# Patient Record
Sex: Male | Born: 1940 | Race: White | Hispanic: No | Smoking: Former smoker
Health system: Southern US, Community
[De-identification: ages and names within clinical notes are randomized; demographics above are authoritative.]

## PROBLEM LIST (undated history)

## (undated) DIAGNOSIS — I10 Essential (primary) hypertension: Secondary | ICD-10-CM

## (undated) DIAGNOSIS — Z974 Presence of external hearing-aid: Secondary | ICD-10-CM

## (undated) DIAGNOSIS — Z9109 Other allergy status, other than to drugs and biological substances: Secondary | ICD-10-CM

## (undated) DIAGNOSIS — E039 Hypothyroidism, unspecified: Secondary | ICD-10-CM

## (undated) DIAGNOSIS — M1990 Unspecified osteoarthritis, unspecified site: Secondary | ICD-10-CM

## (undated) HISTORY — PX: ROTATOR CUFF REPAIR: SHX139

## (undated) HISTORY — PX: CATARACT EXTRACTION W/ INTRAOCULAR LENS  IMPLANT, BILATERAL: SHX1307

---

## 2007-04-29 ENCOUNTER — Other Ambulatory Visit: Payer: Self-pay

## 2007-04-29 ENCOUNTER — Ambulatory Visit: Payer: Self-pay | Admitting: Ophthalmology

## 2007-05-06 ENCOUNTER — Ambulatory Visit: Payer: Self-pay | Admitting: Ophthalmology

## 2011-06-19 ENCOUNTER — Ambulatory Visit: Payer: Self-pay | Admitting: Ophthalmology

## 2011-12-18 ENCOUNTER — Ambulatory Visit: Payer: Self-pay | Admitting: Ophthalmology

## 2015-07-19 ENCOUNTER — Encounter: Payer: Self-pay | Admitting: *Deleted

## 2015-07-20 NOTE — Discharge Instructions (Signed)

## 2015-07-24 ENCOUNTER — Ambulatory Visit: Payer: Medicare PPO | Admitting: Anesthesiology

## 2015-07-24 ENCOUNTER — Ambulatory Visit
Admission: RE | Admit: 2015-07-24 | Discharge: 2015-07-24 | Disposition: A | Discharge status: Home / Self Care | Payer: Medicare PPO | Source: Ambulatory Visit | Attending: Ophthalmology | Admitting: Ophthalmology

## 2015-07-24 ENCOUNTER — Encounter
Admission: RE | Disposition: A | Discharge status: Home / Self Care | Payer: Self-pay | Source: Ambulatory Visit | Attending: Ophthalmology

## 2015-07-24 DIAGNOSIS — Z87891 Personal history of nicotine dependence: Secondary | ICD-10-CM | POA: Diagnosis not present

## 2015-07-24 DIAGNOSIS — Z9842 Cataract extraction status, left eye: Secondary | ICD-10-CM | POA: Insufficient documentation

## 2015-07-24 DIAGNOSIS — H401110 Primary open-angle glaucoma, right eye, stage unspecified: Secondary | ICD-10-CM | POA: Diagnosis not present

## 2015-07-24 DIAGNOSIS — H4440 Unspecified hypotony of eye: Secondary | ICD-10-CM | POA: Diagnosis not present

## 2015-07-24 DIAGNOSIS — H9193 Unspecified hearing loss, bilateral: Secondary | ICD-10-CM | POA: Diagnosis not present

## 2015-07-24 DIAGNOSIS — E079 Disorder of thyroid, unspecified: Secondary | ICD-10-CM | POA: Insufficient documentation

## 2015-07-24 DIAGNOSIS — Z9841 Cataract extraction status, right eye: Secondary | ICD-10-CM | POA: Diagnosis not present

## 2015-07-24 DIAGNOSIS — Z880 Allergy status to penicillin: Secondary | ICD-10-CM | POA: Insufficient documentation

## 2015-07-24 DIAGNOSIS — Z885 Allergy status to narcotic agent status: Secondary | ICD-10-CM | POA: Insufficient documentation

## 2015-07-24 DIAGNOSIS — Z888 Allergy status to other drugs, medicaments and biological substances status: Secondary | ICD-10-CM | POA: Insufficient documentation

## 2015-07-24 DIAGNOSIS — H401190 Primary open-angle glaucoma, unspecified eye, stage unspecified: Secondary | ICD-10-CM | POA: Diagnosis present

## 2015-07-24 HISTORY — DX: Presence of external hearing-aid: Z97.4

## 2015-07-24 HISTORY — DX: Essential (primary) hypertension: I10

## 2015-07-24 HISTORY — DX: Hypothyroidism, unspecified: E03.9

## 2015-07-24 HISTORY — DX: Unspecified osteoarthritis, unspecified site: M19.90

## 2015-07-24 HISTORY — PX: SCAR REVISION: SHX5285

## 2015-07-24 HISTORY — DX: Other allergy status, other than to drugs and biological substances: Z91.09

## 2015-07-24 SURGERY — REVISION, SCAR
Anesthesia: Monitor Anesthesia Care | Laterality: Right | Wound class: Clean

## 2015-07-24 MED ORDER — TETRACAINE HCL 0.5 % OP SOLN
OPHTHALMIC | Status: DC | PRN
  Administered 2015-07-24: 3 [drp]

## 2015-07-24 MED ORDER — NEOMYCIN-POLYMYXIN-DEXAMETH 3.5-10000-0.1 OP OINT
TOPICAL_OINTMENT | OPHTHALMIC | Status: DC | PRN
  Administered 2015-07-24: 1

## 2015-07-24 MED ORDER — LACTATED RINGERS IV SOLN: INTRAVENOUS | Status: DC

## 2015-07-24 MED ORDER — MOXIFLOXACIN HCL 0.5 % OP SOLN
1.0000 [drp] | OPHTHALMIC | Status: DC | PRN
  Administered 2015-07-24 (×3): 1 [drp] via OPHTHALMIC

## 2015-07-24 MED ORDER — MIDAZOLAM HCL 2 MG/2ML IJ SOLN
INTRAMUSCULAR | Status: DC | PRN
  Administered 2015-07-24: 2 mg via INTRAVENOUS

## 2015-07-24 MED ORDER — FENTANYL CITRATE (PF) 100 MCG/2ML IJ SOLN
INTRAMUSCULAR | Status: DC | PRN
  Administered 2015-07-24: 50 ug via INTRAVENOUS

## 2015-07-24 MED ORDER — TETRACAINE HCL 0.5 % OP SOLN
1.0000 [drp] | OPHTHALMIC | Status: DC | PRN
  Administered 2015-07-24: 1 [drp] via OPHTHALMIC

## 2015-07-24 MED ORDER — LIDOCAINE HCL (PF) 4 % IJ SOLN
INTRAMUSCULAR | Status: DC | PRN
  Administered 2015-07-24: .1 mL via OPHTHALMIC

## 2015-07-24 SURGICAL SUPPLY — 32 items
APPLICATOR COTTON TIP 3IN (MISCELLANEOUS) ×4 IMPLANT
BANDAGE EYE OVAL (MISCELLANEOUS) ×4 IMPLANT
BLADE SCLEROTME MULTI-SIDE (MISCELLANEOUS) IMPLANT
CANNULA ANT/CHMB 27GA (MISCELLANEOUS) ×4 IMPLANT
CORD BIP STRL DISP 12FT (MISCELLANEOUS) IMPLANT
CUP MEDICINE 2OZ PLAST GRAD ST (MISCELLANEOUS) IMPLANT
ERASER TAPRD BLUNT STR 20-23GA (MISCELLANEOUS) ×1 IMPLANT
GLOVE BIO SURGEON STRL SZ7 (GLOVE) ×2 IMPLANT
GLOVE SURG LX 6.5 MICRO (GLOVE) ×1
GLOVE SURG LX STRL 6.5 MICRO (GLOVE) ×1 IMPLANT
GOWN STRL REUS W/ TWL LRG LVL3 (GOWN DISPOSABLE) ×2 IMPLANT
GOWN STRL REUS W/TWL LRG LVL3 (GOWN DISPOSABLE) ×2
KIT ROOM TURNOVER OR (KITS) IMPLANT
KNIFE OPTIMUM SIDEPORT 15DEG (MISCELLANEOUS) IMPLANT
MARKER SKIN SURG W/RULER VIO (MISCELLANEOUS) ×2 IMPLANT
NEEDLE FILTER BLUNT 18X 1/2SAF (NEEDLE) ×1
NEEDLE FILTER BLUNT 18X1 1/2 (NEEDLE) ×1 IMPLANT
NEEDLE HYPO 26X3/8 (NEEDLE) IMPLANT
NEEDLE HYPO 30GX1 BEV (NEEDLE) ×2 IMPLANT
PACK EYE AFTER SURG (MISCELLANEOUS) ×2 IMPLANT
SPONGE SURG I SPEAR (MISCELLANEOUS) IMPLANT
SUT ETHILON 10-0 CS-B-6CS-B-6 (SUTURE) ×2
SUT VICRYL 7 0 TG140 8 (SUTURE) ×2 IMPLANT
SUT VICRYL 8 0 BV 130 5 (SUTURE) IMPLANT
SUTURE EHLN 10-0 CS-B-6CS-B-6 (SUTURE) ×1 IMPLANT
SYR 3ML LL SCALE MARK (SYRINGE) ×6 IMPLANT
SYR TB 1ML LUER SLIP (SYRINGE) IMPLANT
SYRINGE 10CC LL (SYRINGE) IMPLANT
TAPERED BLUNT TIP STR 20-23GA (MISCELLANEOUS) ×2
WATER STERILE IRR 250ML POUR (IV SOLUTION) ×2 IMPLANT
WATER STERILE IRR 500ML POUR (IV SOLUTION) IMPLANT
WIPE NON LINTING 3.25X3.25 (MISCELLANEOUS) ×2 IMPLANT

## 2015-07-24 NOTE — Anesthesia Procedure Notes (Signed)
Procedure Name: MAC Performed by: Detra Bores Pre-anesthesia Checklist: Patient identified, Emergency Drugs available, Suction available, Timeout performed and Patient being monitored Patient Re-evaluated:Patient Re-evaluated prior to inductionOxygen Delivery Method: Nasal cannula Placement Confirmation: positive ETCO2       

## 2015-07-24 NOTE — Anesthesia Postprocedure Evaluation (Signed)
  Anesthesia Post-op Note  Patient: Anthony Moreno  Procedure(s) Performed: Procedure(s): REVISION REPAIR WOUND TRABECTULECTOMY SITE - DO NOT PULL SUPPLIES ON CARD (Right)  Anesthesia type:MAC  Patient location: PACU  Post pain: Pain level controlled  Post assessment: Post-op Vital signs reviewed, Patient's Cardiovascular Status Stable, Respiratory Function Stable, Patent Airway and No signs of Nausea or vomiting  Post vital signs: Reviewed and stable  Last Vitals:  Filed Vitals:   07/24/15 1227  BP: 134/74  Pulse: 62  Temp:   Resp: 17    Level of consciousness: awake, alert  and patient cooperative  Complications: No apparent anesthesia complications

## 2015-07-24 NOTE — Anesthesia Preprocedure Evaluation (Signed)
Anesthesia Evaluation  Patient identified by MRN, date of birth, ID band Patient awake    Airway Mallampati: II  TM Distance: >3 FB Neck ROM: Full    Dental   Pulmonary former smoker,           Cardiovascular hypertension, Pt. on medications      Neuro/Psych    GI/Hepatic   Endo/Other  Hypothyroidism   Renal/GU      Musculoskeletal   Abdominal   Peds  Hematology   Anesthesia Other Findings   Reproductive/Obstetrics                             Anesthesia Physical Anesthesia Plan  ASA: II  Anesthesia Plan: MAC   Post-op Pain Management:    Induction: Intravenous  Airway Management Planned:   Additional Equipment:   Intra-op Plan:   Post-operative Plan:   Informed Consent: I have reviewed the patients History and Physical, chart, labs and discussed the procedure including the risks, benefits and alternatives for the proposed anesthesia with the patient or authorized representative who has indicated his/her understanding and acceptance.     Plan Discussed with: CRNA  Anesthesia Plan Comments:         Anesthesia Quick Evaluation

## 2015-07-24 NOTE — Op Note (Signed)
Date of Surgery: 07/24/15  PREOPERATIVE DIAGNOSES: 1. Primary open angle glaucoma, right eye. 2. Hypotony right eye  POSTOPERATIVE DIAGNOSES: 1. Same  PROCEDURE PERFORMED: 1. Transconjunctival sutures to limit flow via trabeculectomy site, right eye.  SURGEON: Almon Hercules, MD.    ANESTHESIA: Monitored anesthesia care.  IMPLANTS: none  COMPLICATIONS: None.  DESCRIPTION OF PROCEDURE: After informed consent was obtained, the patient was brought to the operating room and placed in the supine position.  The patient was then prepped and draped in the usual sterile fashion for intraocular surgery on the right eye.  A wire lid speculum was placed.  A 7-0 vicryl suture was placed superiorly to expose the superior conjunctiva.  Three individual 10-0 nylon sutures were placed through the conjunctiva and sclera on the two sides and apex of the previous triangular trabeculectomy flap.  They were sutured tightly and knots buried beneath the conjunctiva.  The conjunctiva remained Seidel negative at the end of this process.  At the end of the case, the corneal limbal traction suture was removed as was the wire lid speculum.  The eye was dressed with an application of Maxitrol ointment, and the eye was pressure patched closed.  The patient was brought to the recovery area having tolerated the procedure with no complications.

## 2015-07-24 NOTE — H&P (Signed)
H+P reviewed and is up to date, please see paper chart.  

## 2015-07-24 NOTE — Transfer of Care (Signed)
Immediate Anesthesia Transfer of Care Note  Patient: Anthony Moreno  Procedure(s) Performed: Procedure(s): [place] - DO NOT PULL SUPPLIES ON CARD (Right)  Patient Location: PACU  Anesthesia Type: MAC  Level of Consciousness: awake, alert  and patient cooperative  Airway and Oxygen Therapy: Patient Spontanous Breathing and Patient connected to supplemental oxygen  Post-op Assessment: Post-op Vital signs reviewed, Patient's Cardiovascular Status Stable, Respiratory Function Stable, Patent Airway and No signs of Nausea or vomiting  Post-op Vital Signs: Reviewed and stable  Complications: No apparent anesthesia complications

## 2015-07-25 ENCOUNTER — Encounter: Payer: Self-pay | Admitting: Ophthalmology

## 2015-11-09 DIAGNOSIS — E785 Hyperlipidemia, unspecified: Secondary | ICD-10-CM | POA: Diagnosis not present

## 2015-11-09 DIAGNOSIS — R739 Hyperglycemia, unspecified: Secondary | ICD-10-CM | POA: Diagnosis not present

## 2015-11-09 DIAGNOSIS — N183 Chronic kidney disease, stage 3 (moderate): Secondary | ICD-10-CM | POA: Diagnosis not present

## 2015-11-09 DIAGNOSIS — Z1389 Encounter for screening for other disorder: Secondary | ICD-10-CM | POA: Diagnosis not present

## 2015-11-09 DIAGNOSIS — Z6830 Body mass index (BMI) 30.0-30.9, adult: Secondary | ICD-10-CM | POA: Diagnosis not present

## 2015-11-09 DIAGNOSIS — I10 Essential (primary) hypertension: Secondary | ICD-10-CM | POA: Diagnosis not present

## 2015-11-09 DIAGNOSIS — Z79899 Other long term (current) drug therapy: Secondary | ICD-10-CM | POA: Diagnosis not present

## 2015-11-09 DIAGNOSIS — E039 Hypothyroidism, unspecified: Secondary | ICD-10-CM | POA: Diagnosis not present

## 2015-11-24 DIAGNOSIS — E875 Hyperkalemia: Secondary | ICD-10-CM | POA: Diagnosis not present

## 2015-12-14 DIAGNOSIS — H401132 Primary open-angle glaucoma, bilateral, moderate stage: Secondary | ICD-10-CM | POA: Diagnosis not present

## 2015-12-20 DIAGNOSIS — H401133 Primary open-angle glaucoma, bilateral, severe stage: Secondary | ICD-10-CM | POA: Diagnosis not present

## 2016-01-15 DIAGNOSIS — L309 Dermatitis, unspecified: Secondary | ICD-10-CM | POA: Diagnosis not present

## 2016-01-15 DIAGNOSIS — D239 Other benign neoplasm of skin, unspecified: Secondary | ICD-10-CM | POA: Diagnosis not present

## 2016-01-15 DIAGNOSIS — L570 Actinic keratosis: Secondary | ICD-10-CM | POA: Diagnosis not present

## 2016-03-20 DIAGNOSIS — E039 Hypothyroidism, unspecified: Secondary | ICD-10-CM | POA: Diagnosis not present

## 2016-03-20 DIAGNOSIS — E785 Hyperlipidemia, unspecified: Secondary | ICD-10-CM | POA: Diagnosis not present

## 2016-03-20 DIAGNOSIS — N183 Chronic kidney disease, stage 3 (moderate): Secondary | ICD-10-CM | POA: Diagnosis not present

## 2016-03-20 DIAGNOSIS — E1121 Type 2 diabetes mellitus with diabetic nephropathy: Secondary | ICD-10-CM | POA: Diagnosis not present

## 2016-03-20 DIAGNOSIS — I10 Essential (primary) hypertension: Secondary | ICD-10-CM | POA: Diagnosis not present

## 2016-03-20 DIAGNOSIS — Z79899 Other long term (current) drug therapy: Secondary | ICD-10-CM | POA: Diagnosis not present

## 2016-03-20 DIAGNOSIS — Z6830 Body mass index (BMI) 30.0-30.9, adult: Secondary | ICD-10-CM | POA: Diagnosis not present

## 2016-03-20 DIAGNOSIS — Z9181 History of falling: Secondary | ICD-10-CM | POA: Diagnosis not present

## 2016-04-02 DIAGNOSIS — L570 Actinic keratosis: Secondary | ICD-10-CM | POA: Diagnosis not present

## 2016-04-02 DIAGNOSIS — C44319 Basal cell carcinoma of skin of other parts of face: Secondary | ICD-10-CM | POA: Diagnosis not present

## 2016-04-02 DIAGNOSIS — C44311 Basal cell carcinoma of skin of nose: Secondary | ICD-10-CM | POA: Diagnosis not present

## 2016-04-24 DIAGNOSIS — H401133 Primary open-angle glaucoma, bilateral, severe stage: Secondary | ICD-10-CM | POA: Diagnosis not present

## 2016-05-23 DIAGNOSIS — C44319 Basal cell carcinoma of skin of other parts of face: Secondary | ICD-10-CM | POA: Diagnosis not present

## 2016-05-23 DIAGNOSIS — C44321 Squamous cell carcinoma of skin of nose: Secondary | ICD-10-CM | POA: Diagnosis not present

## 2016-05-23 DIAGNOSIS — L570 Actinic keratosis: Secondary | ICD-10-CM | POA: Diagnosis not present

## 2016-07-10 DIAGNOSIS — L820 Inflamed seborrheic keratosis: Secondary | ICD-10-CM | POA: Diagnosis not present

## 2016-07-10 DIAGNOSIS — D485 Neoplasm of uncertain behavior of skin: Secondary | ICD-10-CM | POA: Diagnosis not present

## 2016-07-10 DIAGNOSIS — D0439 Carcinoma in situ of skin of other parts of face: Secondary | ICD-10-CM | POA: Diagnosis not present

## 2016-07-11 DIAGNOSIS — Z6830 Body mass index (BMI) 30.0-30.9, adult: Secondary | ICD-10-CM | POA: Diagnosis not present

## 2016-07-11 DIAGNOSIS — J069 Acute upper respiratory infection, unspecified: Secondary | ICD-10-CM | POA: Diagnosis not present

## 2016-07-25 DIAGNOSIS — N183 Chronic kidney disease, stage 3 (moderate): Secondary | ICD-10-CM | POA: Diagnosis not present

## 2016-07-25 DIAGNOSIS — Z125 Encounter for screening for malignant neoplasm of prostate: Secondary | ICD-10-CM | POA: Diagnosis not present

## 2016-07-25 DIAGNOSIS — E119 Type 2 diabetes mellitus without complications: Secondary | ICD-10-CM | POA: Diagnosis not present

## 2016-07-25 DIAGNOSIS — Z79899 Other long term (current) drug therapy: Secondary | ICD-10-CM | POA: Diagnosis not present

## 2016-07-25 DIAGNOSIS — E785 Hyperlipidemia, unspecified: Secondary | ICD-10-CM | POA: Diagnosis not present

## 2016-07-25 DIAGNOSIS — I10 Essential (primary) hypertension: Secondary | ICD-10-CM | POA: Diagnosis not present

## 2016-07-25 DIAGNOSIS — E039 Hypothyroidism, unspecified: Secondary | ICD-10-CM | POA: Diagnosis not present

## 2016-07-25 DIAGNOSIS — Z6830 Body mass index (BMI) 30.0-30.9, adult: Secondary | ICD-10-CM | POA: Diagnosis not present

## 2016-10-01 DIAGNOSIS — L570 Actinic keratosis: Secondary | ICD-10-CM | POA: Diagnosis not present

## 2016-10-01 DIAGNOSIS — D492 Neoplasm of unspecified behavior of bone, soft tissue, and skin: Secondary | ICD-10-CM | POA: Diagnosis not present

## 2016-10-21 DIAGNOSIS — H401333 Pigmentary glaucoma, bilateral, severe stage: Secondary | ICD-10-CM | POA: Diagnosis not present

## 2016-10-30 DIAGNOSIS — H401133 Primary open-angle glaucoma, bilateral, severe stage: Secondary | ICD-10-CM | POA: Diagnosis not present

## 2016-11-14 DIAGNOSIS — J0110 Acute frontal sinusitis, unspecified: Secondary | ICD-10-CM | POA: Diagnosis not present

## 2016-11-14 DIAGNOSIS — J0100 Acute maxillary sinusitis, unspecified: Secondary | ICD-10-CM | POA: Diagnosis not present

## 2016-11-14 DIAGNOSIS — R05 Cough: Secondary | ICD-10-CM | POA: Diagnosis not present

## 2016-11-14 DIAGNOSIS — Z79899 Other long term (current) drug therapy: Secondary | ICD-10-CM | POA: Diagnosis not present

## 2016-11-14 DIAGNOSIS — Z6830 Body mass index (BMI) 30.0-30.9, adult: Secondary | ICD-10-CM | POA: Diagnosis not present

## 2016-11-21 DIAGNOSIS — N183 Chronic kidney disease, stage 3 (moderate): Secondary | ICD-10-CM | POA: Diagnosis not present

## 2016-11-21 DIAGNOSIS — E785 Hyperlipidemia, unspecified: Secondary | ICD-10-CM | POA: Diagnosis not present

## 2016-11-21 DIAGNOSIS — Z139 Encounter for screening, unspecified: Secondary | ICD-10-CM | POA: Diagnosis not present

## 2016-11-21 DIAGNOSIS — J45909 Unspecified asthma, uncomplicated: Secondary | ICD-10-CM | POA: Diagnosis not present

## 2016-11-21 DIAGNOSIS — E039 Hypothyroidism, unspecified: Secondary | ICD-10-CM | POA: Diagnosis not present

## 2016-11-21 DIAGNOSIS — Z79899 Other long term (current) drug therapy: Secondary | ICD-10-CM | POA: Diagnosis not present

## 2016-11-21 DIAGNOSIS — I10 Essential (primary) hypertension: Secondary | ICD-10-CM | POA: Diagnosis not present

## 2016-11-21 DIAGNOSIS — Z1389 Encounter for screening for other disorder: Secondary | ICD-10-CM | POA: Diagnosis not present

## 2016-11-21 DIAGNOSIS — E119 Type 2 diabetes mellitus without complications: Secondary | ICD-10-CM | POA: Diagnosis not present

## 2016-11-21 DIAGNOSIS — Z6830 Body mass index (BMI) 30.0-30.9, adult: Secondary | ICD-10-CM | POA: Diagnosis not present

## 2016-12-26 DIAGNOSIS — Z6830 Body mass index (BMI) 30.0-30.9, adult: Secondary | ICD-10-CM | POA: Diagnosis not present

## 2016-12-26 DIAGNOSIS — J45909 Unspecified asthma, uncomplicated: Secondary | ICD-10-CM | POA: Diagnosis not present

## 2016-12-26 DIAGNOSIS — J309 Allergic rhinitis, unspecified: Secondary | ICD-10-CM | POA: Diagnosis not present

## 2016-12-26 DIAGNOSIS — E669 Obesity, unspecified: Secondary | ICD-10-CM | POA: Diagnosis not present

## 2017-02-04 DIAGNOSIS — R05 Cough: Secondary | ICD-10-CM | POA: Diagnosis not present

## 2017-02-04 DIAGNOSIS — E663 Overweight: Secondary | ICD-10-CM | POA: Diagnosis not present

## 2017-02-04 DIAGNOSIS — Z6829 Body mass index (BMI) 29.0-29.9, adult: Secondary | ICD-10-CM | POA: Diagnosis not present

## 2017-02-10 DIAGNOSIS — R05 Cough: Secondary | ICD-10-CM | POA: Diagnosis not present

## 2017-02-10 DIAGNOSIS — Z6829 Body mass index (BMI) 29.0-29.9, adult: Secondary | ICD-10-CM | POA: Diagnosis not present

## 2017-02-10 DIAGNOSIS — Z79899 Other long term (current) drug therapy: Secondary | ICD-10-CM | POA: Diagnosis not present

## 2017-02-17 DIAGNOSIS — R05 Cough: Secondary | ICD-10-CM | POA: Diagnosis not present

## 2017-02-17 DIAGNOSIS — Z9181 History of falling: Secondary | ICD-10-CM | POA: Diagnosis not present

## 2017-02-17 DIAGNOSIS — Z6829 Body mass index (BMI) 29.0-29.9, adult: Secondary | ICD-10-CM | POA: Diagnosis not present

## 2017-02-17 DIAGNOSIS — Z1389 Encounter for screening for other disorder: Secondary | ICD-10-CM | POA: Diagnosis not present

## 2017-03-27 DIAGNOSIS — I10 Essential (primary) hypertension: Secondary | ICD-10-CM | POA: Diagnosis not present

## 2017-03-27 DIAGNOSIS — Z6829 Body mass index (BMI) 29.0-29.9, adult: Secondary | ICD-10-CM | POA: Diagnosis not present

## 2017-04-03 DIAGNOSIS — E1121 Type 2 diabetes mellitus with diabetic nephropathy: Secondary | ICD-10-CM | POA: Diagnosis not present

## 2017-04-03 DIAGNOSIS — I10 Essential (primary) hypertension: Secondary | ICD-10-CM | POA: Diagnosis not present

## 2017-04-03 DIAGNOSIS — N183 Chronic kidney disease, stage 3 (moderate): Secondary | ICD-10-CM | POA: Diagnosis not present

## 2017-04-03 DIAGNOSIS — Z79899 Other long term (current) drug therapy: Secondary | ICD-10-CM | POA: Diagnosis not present

## 2017-04-03 DIAGNOSIS — Z6829 Body mass index (BMI) 29.0-29.9, adult: Secondary | ICD-10-CM | POA: Diagnosis not present

## 2017-04-03 DIAGNOSIS — E039 Hypothyroidism, unspecified: Secondary | ICD-10-CM | POA: Diagnosis not present

## 2017-04-03 DIAGNOSIS — E785 Hyperlipidemia, unspecified: Secondary | ICD-10-CM | POA: Diagnosis not present

## 2017-04-21 DIAGNOSIS — L570 Actinic keratosis: Secondary | ICD-10-CM | POA: Diagnosis not present

## 2017-04-21 DIAGNOSIS — L728 Other follicular cysts of the skin and subcutaneous tissue: Secondary | ICD-10-CM | POA: Diagnosis not present

## 2017-04-21 DIAGNOSIS — D229 Melanocytic nevi, unspecified: Secondary | ICD-10-CM | POA: Diagnosis not present

## 2017-04-21 DIAGNOSIS — B079 Viral wart, unspecified: Secondary | ICD-10-CM | POA: Diagnosis not present

## 2017-05-14 DIAGNOSIS — L723 Sebaceous cyst: Secondary | ICD-10-CM | POA: Diagnosis not present

## 2017-05-16 DIAGNOSIS — H401133 Primary open-angle glaucoma, bilateral, severe stage: Secondary | ICD-10-CM | POA: Diagnosis not present

## 2017-05-19 DIAGNOSIS — H44421 Hypotony of right eye due to ocular fistula: Secondary | ICD-10-CM | POA: Diagnosis not present

## 2017-05-21 DIAGNOSIS — E669 Obesity, unspecified: Secondary | ICD-10-CM | POA: Diagnosis not present

## 2017-05-21 DIAGNOSIS — I10 Essential (primary) hypertension: Secondary | ICD-10-CM | POA: Diagnosis not present

## 2017-05-21 DIAGNOSIS — S80869A Insect bite (nonvenomous), unspecified lower leg, initial encounter: Secondary | ICD-10-CM | POA: Diagnosis not present

## 2017-05-21 DIAGNOSIS — Z6830 Body mass index (BMI) 30.0-30.9, adult: Secondary | ICD-10-CM | POA: Diagnosis not present

## 2017-06-04 DIAGNOSIS — R0789 Other chest pain: Secondary | ICD-10-CM | POA: Diagnosis not present

## 2017-06-04 DIAGNOSIS — R001 Bradycardia, unspecified: Secondary | ICD-10-CM | POA: Diagnosis not present

## 2017-06-04 DIAGNOSIS — I10 Essential (primary) hypertension: Secondary | ICD-10-CM | POA: Diagnosis not present

## 2017-06-04 DIAGNOSIS — R0609 Other forms of dyspnea: Secondary | ICD-10-CM | POA: Diagnosis not present

## 2017-06-05 DIAGNOSIS — R9431 Abnormal electrocardiogram [ECG] [EKG]: Secondary | ICD-10-CM | POA: Diagnosis not present

## 2017-06-05 DIAGNOSIS — R001 Bradycardia, unspecified: Secondary | ICD-10-CM | POA: Diagnosis not present

## 2017-06-05 DIAGNOSIS — R0789 Other chest pain: Secondary | ICD-10-CM | POA: Diagnosis not present

## 2017-06-09 DIAGNOSIS — H44421 Hypotony of right eye due to ocular fistula: Secondary | ICD-10-CM | POA: Diagnosis not present

## 2017-06-23 DIAGNOSIS — Z6830 Body mass index (BMI) 30.0-30.9, adult: Secondary | ICD-10-CM | POA: Diagnosis not present

## 2017-06-23 DIAGNOSIS — I10 Essential (primary) hypertension: Secondary | ICD-10-CM | POA: Diagnosis not present

## 2017-06-23 DIAGNOSIS — Z79899 Other long term (current) drug therapy: Secondary | ICD-10-CM | POA: Diagnosis not present

## 2017-07-09 DIAGNOSIS — R0609 Other forms of dyspnea: Secondary | ICD-10-CM | POA: Diagnosis not present

## 2017-07-16 DIAGNOSIS — I10 Essential (primary) hypertension: Secondary | ICD-10-CM | POA: Diagnosis not present

## 2017-07-16 DIAGNOSIS — R0683 Snoring: Secondary | ICD-10-CM | POA: Diagnosis not present

## 2017-07-16 DIAGNOSIS — R001 Bradycardia, unspecified: Secondary | ICD-10-CM | POA: Diagnosis not present

## 2017-07-16 DIAGNOSIS — R0609 Other forms of dyspnea: Secondary | ICD-10-CM | POA: Diagnosis not present

## 2017-07-21 DIAGNOSIS — H31401 Unspecified choroidal detachment, right eye: Secondary | ICD-10-CM | POA: Diagnosis not present

## 2017-08-07 DIAGNOSIS — N183 Chronic kidney disease, stage 3 (moderate): Secondary | ICD-10-CM | POA: Diagnosis not present

## 2017-08-07 DIAGNOSIS — I10 Essential (primary) hypertension: Secondary | ICD-10-CM | POA: Diagnosis not present

## 2017-08-07 DIAGNOSIS — E669 Obesity, unspecified: Secondary | ICD-10-CM | POA: Diagnosis not present

## 2017-08-07 DIAGNOSIS — E039 Hypothyroidism, unspecified: Secondary | ICD-10-CM | POA: Diagnosis not present

## 2017-08-07 DIAGNOSIS — Z79899 Other long term (current) drug therapy: Secondary | ICD-10-CM | POA: Diagnosis not present

## 2017-08-07 DIAGNOSIS — E785 Hyperlipidemia, unspecified: Secondary | ICD-10-CM | POA: Diagnosis not present

## 2017-08-07 DIAGNOSIS — Z6830 Body mass index (BMI) 30.0-30.9, adult: Secondary | ICD-10-CM | POA: Diagnosis not present

## 2017-08-07 DIAGNOSIS — E1121 Type 2 diabetes mellitus with diabetic nephropathy: Secondary | ICD-10-CM | POA: Diagnosis not present

## 2017-08-07 DIAGNOSIS — L918 Other hypertrophic disorders of the skin: Secondary | ICD-10-CM | POA: Diagnosis not present

## 2017-08-26 DIAGNOSIS — H44421 Hypotony of right eye due to ocular fistula: Secondary | ICD-10-CM | POA: Diagnosis not present

## 2017-09-03 DIAGNOSIS — Z0000 Encounter for general adult medical examination without abnormal findings: Secondary | ICD-10-CM | POA: Diagnosis not present

## 2017-09-03 DIAGNOSIS — Z139 Encounter for screening, unspecified: Secondary | ICD-10-CM | POA: Diagnosis not present

## 2017-09-03 DIAGNOSIS — E669 Obesity, unspecified: Secondary | ICD-10-CM | POA: Diagnosis not present

## 2017-09-03 DIAGNOSIS — Z6830 Body mass index (BMI) 30.0-30.9, adult: Secondary | ICD-10-CM | POA: Diagnosis not present

## 2017-09-03 DIAGNOSIS — E785 Hyperlipidemia, unspecified: Secondary | ICD-10-CM | POA: Diagnosis not present

## 2017-09-03 DIAGNOSIS — Z125 Encounter for screening for malignant neoplasm of prostate: Secondary | ICD-10-CM | POA: Diagnosis not present

## 2017-09-17 DIAGNOSIS — R0609 Other forms of dyspnea: Secondary | ICD-10-CM | POA: Diagnosis not present

## 2017-09-17 DIAGNOSIS — R0683 Snoring: Secondary | ICD-10-CM | POA: Diagnosis not present

## 2017-09-17 DIAGNOSIS — I10 Essential (primary) hypertension: Secondary | ICD-10-CM | POA: Diagnosis not present

## 2017-09-17 DIAGNOSIS — R001 Bradycardia, unspecified: Secondary | ICD-10-CM | POA: Diagnosis not present

## 2017-10-17 DIAGNOSIS — I10 Essential (primary) hypertension: Secondary | ICD-10-CM | POA: Diagnosis not present

## 2017-10-17 DIAGNOSIS — R0683 Snoring: Secondary | ICD-10-CM | POA: Diagnosis not present

## 2017-10-17 DIAGNOSIS — R001 Bradycardia, unspecified: Secondary | ICD-10-CM | POA: Diagnosis not present

## 2017-10-29 DIAGNOSIS — I10 Essential (primary) hypertension: Secondary | ICD-10-CM | POA: Diagnosis not present

## 2017-10-29 DIAGNOSIS — R0609 Other forms of dyspnea: Secondary | ICD-10-CM | POA: Diagnosis not present

## 2017-10-29 DIAGNOSIS — J40 Bronchitis, not specified as acute or chronic: Secondary | ICD-10-CM | POA: Diagnosis not present

## 2017-10-29 DIAGNOSIS — R001 Bradycardia, unspecified: Secondary | ICD-10-CM | POA: Diagnosis not present

## 2017-11-05 DIAGNOSIS — I10 Essential (primary) hypertension: Secondary | ICD-10-CM | POA: Diagnosis not present

## 2017-11-26 DIAGNOSIS — H401133 Primary open-angle glaucoma, bilateral, severe stage: Secondary | ICD-10-CM | POA: Diagnosis not present

## 2017-12-01 DIAGNOSIS — H401133 Primary open-angle glaucoma, bilateral, severe stage: Secondary | ICD-10-CM | POA: Diagnosis not present

## 2017-12-08 DIAGNOSIS — E1121 Type 2 diabetes mellitus with diabetic nephropathy: Secondary | ICD-10-CM | POA: Diagnosis not present

## 2017-12-08 DIAGNOSIS — E785 Hyperlipidemia, unspecified: Secondary | ICD-10-CM | POA: Diagnosis not present

## 2017-12-08 DIAGNOSIS — N183 Chronic kidney disease, stage 3 (moderate): Secondary | ICD-10-CM | POA: Diagnosis not present

## 2017-12-08 DIAGNOSIS — Z6830 Body mass index (BMI) 30.0-30.9, adult: Secondary | ICD-10-CM | POA: Diagnosis not present

## 2017-12-08 DIAGNOSIS — Z79899 Other long term (current) drug therapy: Secondary | ICD-10-CM | POA: Diagnosis not present

## 2017-12-08 DIAGNOSIS — E039 Hypothyroidism, unspecified: Secondary | ICD-10-CM | POA: Diagnosis not present

## 2017-12-08 DIAGNOSIS — I10 Essential (primary) hypertension: Secondary | ICD-10-CM | POA: Diagnosis not present

## 2017-12-10 DIAGNOSIS — I10 Essential (primary) hypertension: Secondary | ICD-10-CM | POA: Diagnosis not present

## 2017-12-10 DIAGNOSIS — R0609 Other forms of dyspnea: Secondary | ICD-10-CM | POA: Diagnosis not present

## 2017-12-10 DIAGNOSIS — R001 Bradycardia, unspecified: Secondary | ICD-10-CM | POA: Diagnosis not present

## 2018-01-14 DIAGNOSIS — R945 Abnormal results of liver function studies: Secondary | ICD-10-CM | POA: Diagnosis not present

## 2018-01-28 DIAGNOSIS — S30861A Insect bite (nonvenomous) of abdominal wall, initial encounter: Secondary | ICD-10-CM | POA: Diagnosis not present

## 2018-01-28 DIAGNOSIS — Z6830 Body mass index (BMI) 30.0-30.9, adult: Secondary | ICD-10-CM | POA: Diagnosis not present

## 2018-02-23 DIAGNOSIS — D229 Melanocytic nevi, unspecified: Secondary | ICD-10-CM | POA: Diagnosis not present

## 2018-02-23 DIAGNOSIS — C44319 Basal cell carcinoma of skin of other parts of face: Secondary | ICD-10-CM | POA: Diagnosis not present

## 2018-02-23 DIAGNOSIS — D485 Neoplasm of uncertain behavior of skin: Secondary | ICD-10-CM | POA: Diagnosis not present

## 2018-02-23 DIAGNOSIS — L570 Actinic keratosis: Secondary | ICD-10-CM | POA: Diagnosis not present

## 2018-03-09 DIAGNOSIS — H401133 Primary open-angle glaucoma, bilateral, severe stage: Secondary | ICD-10-CM | POA: Diagnosis not present

## 2018-03-19 DIAGNOSIS — C44319 Basal cell carcinoma of skin of other parts of face: Secondary | ICD-10-CM | POA: Diagnosis not present

## 2018-04-09 DIAGNOSIS — N183 Chronic kidney disease, stage 3 (moderate): Secondary | ICD-10-CM | POA: Diagnosis not present

## 2018-04-09 DIAGNOSIS — I10 Essential (primary) hypertension: Secondary | ICD-10-CM | POA: Diagnosis not present

## 2018-04-09 DIAGNOSIS — Z6830 Body mass index (BMI) 30.0-30.9, adult: Secondary | ICD-10-CM | POA: Diagnosis not present

## 2018-04-09 DIAGNOSIS — E785 Hyperlipidemia, unspecified: Secondary | ICD-10-CM | POA: Diagnosis not present

## 2018-04-09 DIAGNOSIS — Z79899 Other long term (current) drug therapy: Secondary | ICD-10-CM | POA: Diagnosis not present

## 2018-04-09 DIAGNOSIS — E1121 Type 2 diabetes mellitus with diabetic nephropathy: Secondary | ICD-10-CM | POA: Diagnosis not present

## 2018-04-09 DIAGNOSIS — E039 Hypothyroidism, unspecified: Secondary | ICD-10-CM | POA: Diagnosis not present

## 2018-04-15 DIAGNOSIS — R001 Bradycardia, unspecified: Secondary | ICD-10-CM | POA: Diagnosis not present

## 2018-04-15 DIAGNOSIS — I10 Essential (primary) hypertension: Secondary | ICD-10-CM | POA: Diagnosis not present

## 2018-05-12 DIAGNOSIS — C44311 Basal cell carcinoma of skin of nose: Secondary | ICD-10-CM | POA: Diagnosis not present

## 2018-05-12 DIAGNOSIS — L570 Actinic keratosis: Secondary | ICD-10-CM | POA: Diagnosis not present

## 2018-05-12 DIAGNOSIS — D485 Neoplasm of uncertain behavior of skin: Secondary | ICD-10-CM | POA: Diagnosis not present

## 2018-06-11 DIAGNOSIS — H401133 Primary open-angle glaucoma, bilateral, severe stage: Secondary | ICD-10-CM | POA: Diagnosis not present

## 2018-06-25 DIAGNOSIS — C44311 Basal cell carcinoma of skin of nose: Secondary | ICD-10-CM | POA: Diagnosis not present

## 2018-08-12 DIAGNOSIS — E039 Hypothyroidism, unspecified: Secondary | ICD-10-CM | POA: Diagnosis not present

## 2018-08-12 DIAGNOSIS — N183 Chronic kidney disease, stage 3 (moderate): Secondary | ICD-10-CM | POA: Diagnosis not present

## 2018-08-12 DIAGNOSIS — E785 Hyperlipidemia, unspecified: Secondary | ICD-10-CM | POA: Diagnosis not present

## 2018-08-12 DIAGNOSIS — E1121 Type 2 diabetes mellitus with diabetic nephropathy: Secondary | ICD-10-CM | POA: Diagnosis not present

## 2018-08-12 DIAGNOSIS — I10 Essential (primary) hypertension: Secondary | ICD-10-CM | POA: Diagnosis not present

## 2018-08-12 DIAGNOSIS — Z1331 Encounter for screening for depression: Secondary | ICD-10-CM | POA: Diagnosis not present

## 2018-08-12 DIAGNOSIS — Z6830 Body mass index (BMI) 30.0-30.9, adult: Secondary | ICD-10-CM | POA: Diagnosis not present

## 2018-08-12 DIAGNOSIS — Z79899 Other long term (current) drug therapy: Secondary | ICD-10-CM | POA: Diagnosis not present

## 2018-08-12 DIAGNOSIS — Z9181 History of falling: Secondary | ICD-10-CM | POA: Diagnosis not present

## 2018-10-14 DIAGNOSIS — D485 Neoplasm of uncertain behavior of skin: Secondary | ICD-10-CM | POA: Diagnosis not present

## 2018-10-14 DIAGNOSIS — L300 Nummular dermatitis: Secondary | ICD-10-CM | POA: Diagnosis not present

## 2018-10-14 DIAGNOSIS — L719 Rosacea, unspecified: Secondary | ICD-10-CM | POA: Diagnosis not present

## 2018-10-21 DIAGNOSIS — R001 Bradycardia, unspecified: Secondary | ICD-10-CM | POA: Diagnosis not present

## 2018-10-21 DIAGNOSIS — I10 Essential (primary) hypertension: Secondary | ICD-10-CM | POA: Diagnosis not present

## 2018-10-28 DIAGNOSIS — Z6831 Body mass index (BMI) 31.0-31.9, adult: Secondary | ICD-10-CM | POA: Diagnosis not present

## 2018-10-28 DIAGNOSIS — Z1339 Encounter for screening examination for other mental health and behavioral disorders: Secondary | ICD-10-CM | POA: Diagnosis not present

## 2018-10-28 DIAGNOSIS — Z9181 History of falling: Secondary | ICD-10-CM | POA: Diagnosis not present

## 2018-10-28 DIAGNOSIS — Z125 Encounter for screening for malignant neoplasm of prostate: Secondary | ICD-10-CM | POA: Diagnosis not present

## 2018-10-28 DIAGNOSIS — Z1331 Encounter for screening for depression: Secondary | ICD-10-CM | POA: Diagnosis not present

## 2018-10-28 DIAGNOSIS — Z139 Encounter for screening, unspecified: Secondary | ICD-10-CM | POA: Diagnosis not present

## 2018-10-28 DIAGNOSIS — E785 Hyperlipidemia, unspecified: Secondary | ICD-10-CM | POA: Diagnosis not present

## 2018-10-28 DIAGNOSIS — Z0000 Encounter for general adult medical examination without abnormal findings: Secondary | ICD-10-CM | POA: Diagnosis not present

## 2018-12-14 DIAGNOSIS — Z9181 History of falling: Secondary | ICD-10-CM | POA: Diagnosis not present

## 2018-12-14 DIAGNOSIS — I10 Essential (primary) hypertension: Secondary | ICD-10-CM | POA: Diagnosis not present

## 2018-12-14 DIAGNOSIS — I700 Atherosclerosis of aorta: Secondary | ICD-10-CM | POA: Diagnosis not present

## 2018-12-14 DIAGNOSIS — Z79899 Other long term (current) drug therapy: Secondary | ICD-10-CM | POA: Diagnosis not present

## 2018-12-14 DIAGNOSIS — Z6831 Body mass index (BMI) 31.0-31.9, adult: Secondary | ICD-10-CM | POA: Diagnosis not present

## 2018-12-14 DIAGNOSIS — F1121 Opioid dependence, in remission: Secondary | ICD-10-CM | POA: Diagnosis not present

## 2018-12-14 DIAGNOSIS — E039 Hypothyroidism, unspecified: Secondary | ICD-10-CM | POA: Diagnosis not present

## 2018-12-14 DIAGNOSIS — Z1331 Encounter for screening for depression: Secondary | ICD-10-CM | POA: Diagnosis not present

## 2018-12-14 DIAGNOSIS — I480 Paroxysmal atrial fibrillation: Secondary | ICD-10-CM | POA: Diagnosis not present

## 2018-12-14 DIAGNOSIS — N183 Chronic kidney disease, stage 3 (moderate): Secondary | ICD-10-CM | POA: Diagnosis not present

## 2018-12-14 DIAGNOSIS — E785 Hyperlipidemia, unspecified: Secondary | ICD-10-CM | POA: Diagnosis not present

## 2019-02-22 DIAGNOSIS — H401133 Primary open-angle glaucoma, bilateral, severe stage: Secondary | ICD-10-CM | POA: Diagnosis not present

## 2019-02-22 DIAGNOSIS — I10 Essential (primary) hypertension: Secondary | ICD-10-CM | POA: Diagnosis not present

## 2019-02-22 DIAGNOSIS — Z6831 Body mass index (BMI) 31.0-31.9, adult: Secondary | ICD-10-CM | POA: Diagnosis not present

## 2019-03-01 DIAGNOSIS — H401133 Primary open-angle glaucoma, bilateral, severe stage: Secondary | ICD-10-CM | POA: Diagnosis not present

## 2019-03-03 DIAGNOSIS — I10 Essential (primary) hypertension: Secondary | ICD-10-CM | POA: Diagnosis not present

## 2019-03-03 DIAGNOSIS — N183 Chronic kidney disease, stage 3 (moderate): Secondary | ICD-10-CM | POA: Diagnosis not present

## 2019-03-03 DIAGNOSIS — E785 Hyperlipidemia, unspecified: Secondary | ICD-10-CM | POA: Diagnosis not present

## 2019-03-03 DIAGNOSIS — Z6831 Body mass index (BMI) 31.0-31.9, adult: Secondary | ICD-10-CM | POA: Diagnosis not present

## 2019-03-03 DIAGNOSIS — Z79899 Other long term (current) drug therapy: Secondary | ICD-10-CM | POA: Diagnosis not present

## 2019-03-03 DIAGNOSIS — E1121 Type 2 diabetes mellitus with diabetic nephropathy: Secondary | ICD-10-CM | POA: Diagnosis not present

## 2019-03-03 DIAGNOSIS — E039 Hypothyroidism, unspecified: Secondary | ICD-10-CM | POA: Diagnosis not present

## 2019-03-08 DIAGNOSIS — L309 Dermatitis, unspecified: Secondary | ICD-10-CM | POA: Diagnosis not present

## 2019-03-08 DIAGNOSIS — L570 Actinic keratosis: Secondary | ICD-10-CM | POA: Diagnosis not present

## 2019-04-09 DIAGNOSIS — N184 Chronic kidney disease, stage 4 (severe): Secondary | ICD-10-CM | POA: Diagnosis not present

## 2019-04-09 DIAGNOSIS — E1122 Type 2 diabetes mellitus with diabetic chronic kidney disease: Secondary | ICD-10-CM | POA: Diagnosis not present

## 2019-04-09 DIAGNOSIS — I129 Hypertensive chronic kidney disease with stage 1 through stage 4 chronic kidney disease, or unspecified chronic kidney disease: Secondary | ICD-10-CM | POA: Diagnosis not present

## 2019-04-23 ENCOUNTER — Other Ambulatory Visit: Payer: Self-pay | Admitting: Nephrology

## 2019-04-23 DIAGNOSIS — N184 Chronic kidney disease, stage 4 (severe): Secondary | ICD-10-CM

## 2019-04-26 ENCOUNTER — Ambulatory Visit
Admission: RE | Admit: 2019-04-26 | Discharge: 2019-04-26 | Disposition: A | Discharge status: Home / Self Care | Payer: Medicare PPO | Source: Ambulatory Visit | Attending: Nephrology | Admitting: Nephrology

## 2019-04-26 DIAGNOSIS — N184 Chronic kidney disease, stage 4 (severe): Secondary | ICD-10-CM

## 2019-04-28 DIAGNOSIS — R5383 Other fatigue: Secondary | ICD-10-CM | POA: Diagnosis not present

## 2019-04-28 DIAGNOSIS — I10 Essential (primary) hypertension: Secondary | ICD-10-CM | POA: Diagnosis not present

## 2019-04-28 DIAGNOSIS — R001 Bradycardia, unspecified: Secondary | ICD-10-CM | POA: Diagnosis not present

## 2019-04-28 DIAGNOSIS — R5381 Other malaise: Secondary | ICD-10-CM | POA: Diagnosis not present

## 2019-05-11 DIAGNOSIS — R001 Bradycardia, unspecified: Secondary | ICD-10-CM | POA: Diagnosis not present

## 2019-05-11 DIAGNOSIS — I491 Atrial premature depolarization: Secondary | ICD-10-CM | POA: Diagnosis not present

## 2019-05-11 DIAGNOSIS — I493 Ventricular premature depolarization: Secondary | ICD-10-CM | POA: Diagnosis not present

## 2019-05-11 DIAGNOSIS — I471 Supraventricular tachycardia: Secondary | ICD-10-CM | POA: Diagnosis not present

## 2019-06-08 DIAGNOSIS — M109 Gout, unspecified: Secondary | ICD-10-CM | POA: Diagnosis not present

## 2019-06-08 DIAGNOSIS — Z79899 Other long term (current) drug therapy: Secondary | ICD-10-CM | POA: Diagnosis not present

## 2019-07-02 DIAGNOSIS — I10 Essential (primary) hypertension: Secondary | ICD-10-CM | POA: Diagnosis not present

## 2019-07-02 DIAGNOSIS — N184 Chronic kidney disease, stage 4 (severe): Secondary | ICD-10-CM | POA: Diagnosis not present

## 2019-07-02 DIAGNOSIS — R5383 Other fatigue: Secondary | ICD-10-CM | POA: Diagnosis not present

## 2019-07-02 DIAGNOSIS — R001 Bradycardia, unspecified: Secondary | ICD-10-CM | POA: Diagnosis not present

## 2019-07-02 DIAGNOSIS — R5381 Other malaise: Secondary | ICD-10-CM | POA: Diagnosis not present

## 2019-07-05 ENCOUNTER — Other Ambulatory Visit: Payer: Self-pay

## 2019-07-05 DIAGNOSIS — Z20822 Contact with and (suspected) exposure to covid-19: Secondary | ICD-10-CM

## 2019-07-06 LAB — NOVEL CORONAVIRUS, NAA: SARS-CoV-2, NAA: NOT DETECTED

## 2019-07-07 DIAGNOSIS — I129 Hypertensive chronic kidney disease with stage 1 through stage 4 chronic kidney disease, or unspecified chronic kidney disease: Secondary | ICD-10-CM | POA: Diagnosis not present

## 2019-07-07 DIAGNOSIS — N184 Chronic kidney disease, stage 4 (severe): Secondary | ICD-10-CM | POA: Diagnosis not present

## 2019-07-07 DIAGNOSIS — E875 Hyperkalemia: Secondary | ICD-10-CM | POA: Diagnosis not present

## 2019-07-07 DIAGNOSIS — E1122 Type 2 diabetes mellitus with diabetic chronic kidney disease: Secondary | ICD-10-CM | POA: Diagnosis not present

## 2019-07-12 DIAGNOSIS — N184 Chronic kidney disease, stage 4 (severe): Secondary | ICD-10-CM | POA: Diagnosis not present

## 2019-07-12 DIAGNOSIS — E1121 Type 2 diabetes mellitus with diabetic nephropathy: Secondary | ICD-10-CM | POA: Diagnosis not present

## 2019-07-12 DIAGNOSIS — E785 Hyperlipidemia, unspecified: Secondary | ICD-10-CM | POA: Diagnosis not present

## 2019-07-12 DIAGNOSIS — E039 Hypothyroidism, unspecified: Secondary | ICD-10-CM | POA: Diagnosis not present

## 2019-07-12 DIAGNOSIS — I10 Essential (primary) hypertension: Secondary | ICD-10-CM | POA: Diagnosis not present

## 2019-08-02 DIAGNOSIS — L570 Actinic keratosis: Secondary | ICD-10-CM | POA: Diagnosis not present

## 2019-08-30 DIAGNOSIS — H401133 Primary open-angle glaucoma, bilateral, severe stage: Secondary | ICD-10-CM | POA: Diagnosis not present

## 2019-08-30 DIAGNOSIS — H353131 Nonexudative age-related macular degeneration, bilateral, early dry stage: Secondary | ICD-10-CM | POA: Diagnosis not present

## 2019-08-31 IMAGING — US US RENAL
1 series · 14 of 25 positions shown · non-contrast
Comparison: None.

CLINICAL DATA: Chronic kidney disease stage 4.

EXAM:
RENAL / URINARY TRACT ULTRASOUND COMPLETE

[Series 1: us renal · 0.26mm/px · 14 of 42 slices shown]
[im 1/42]
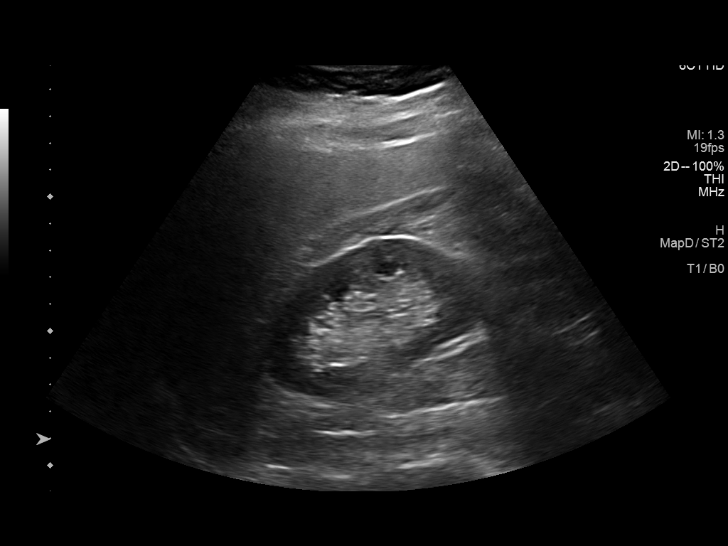
[im 4/42]
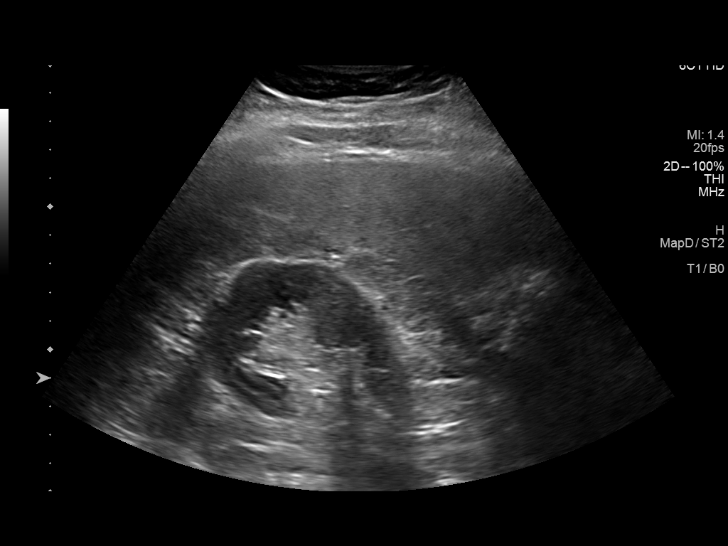
[im 7/42]
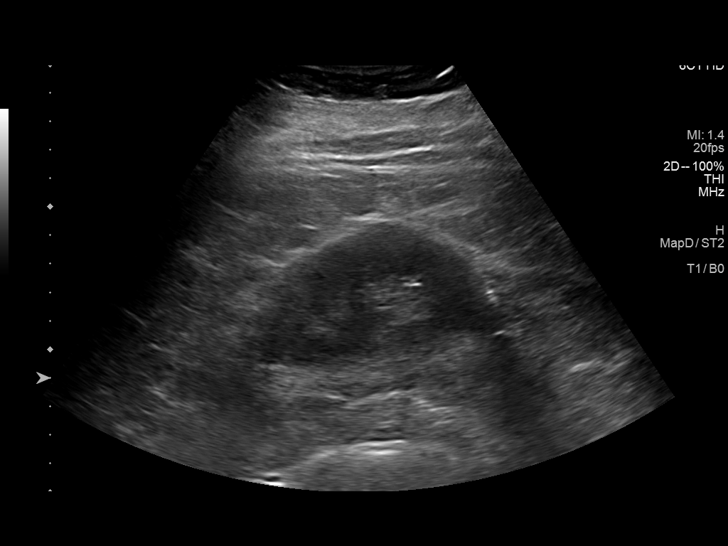
[im 11/42]
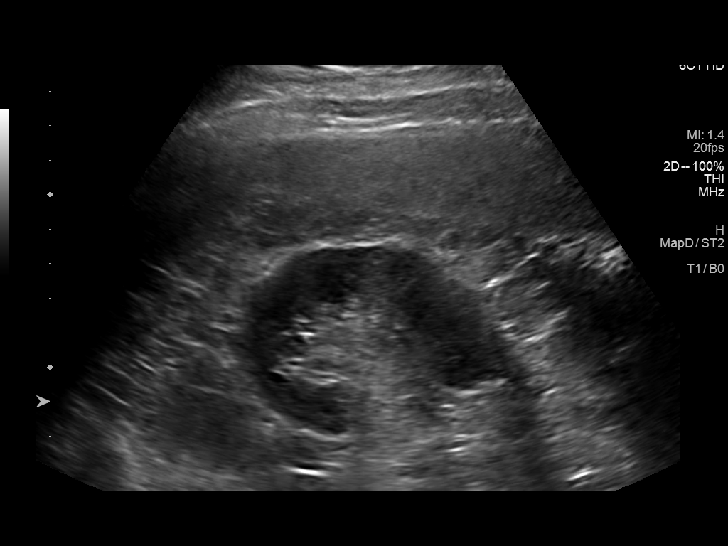
[im 14/42]
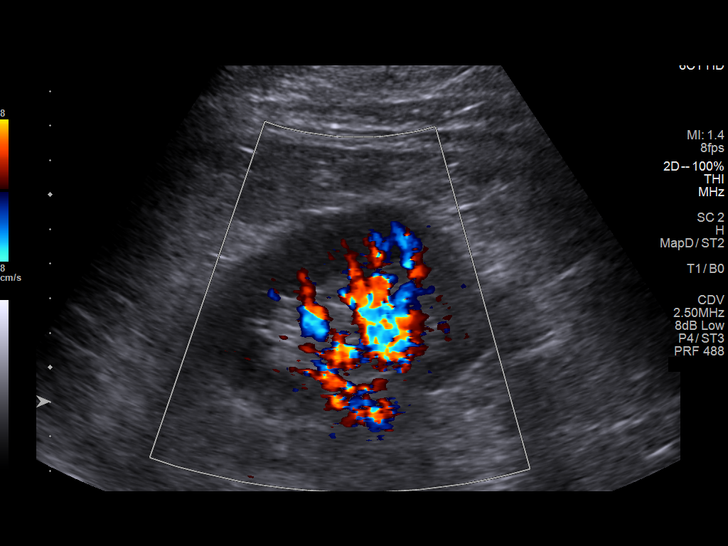
[im 16/42]
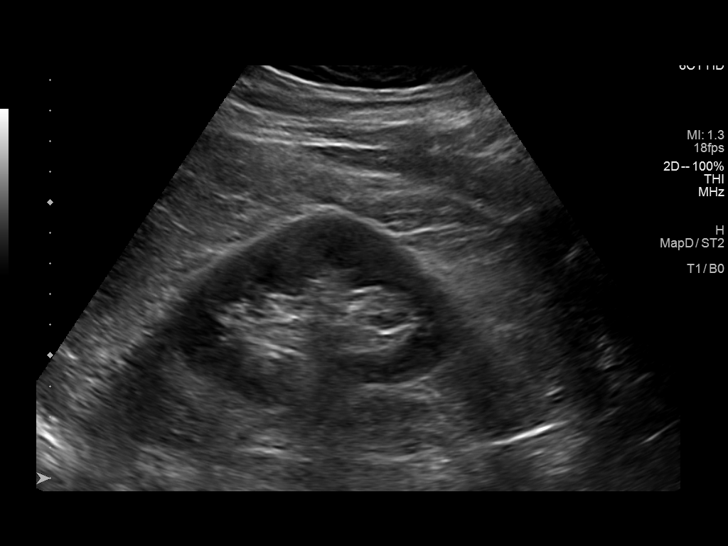
[im 19/42]
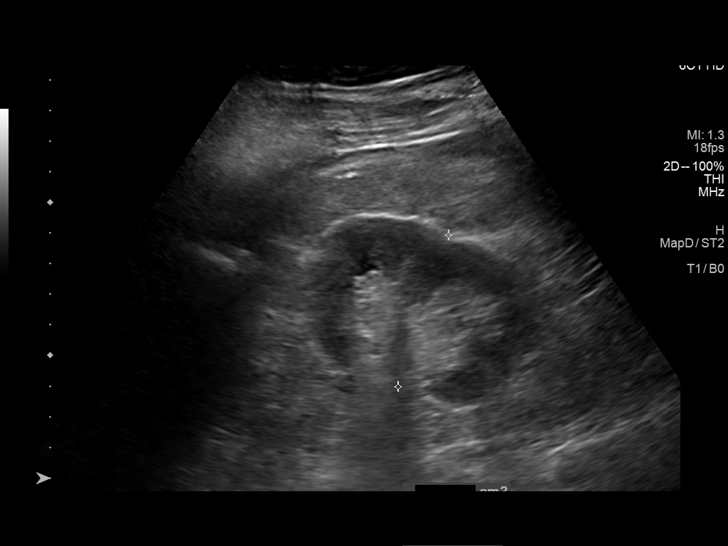
[im 23/42]
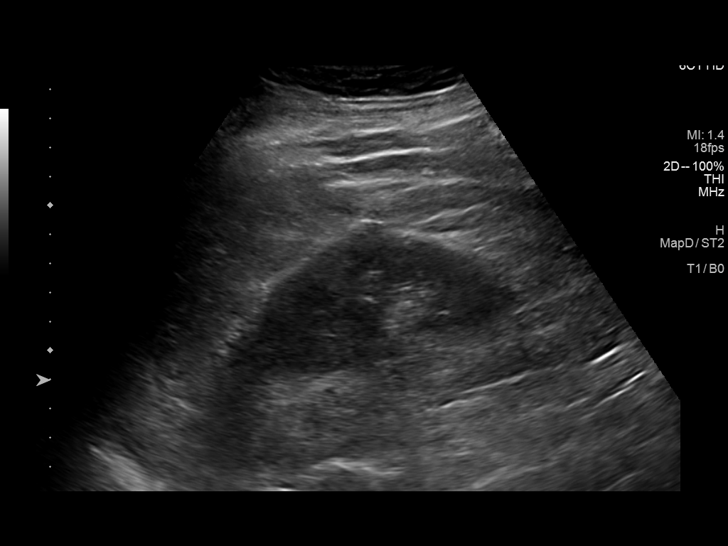
[im 26/42]
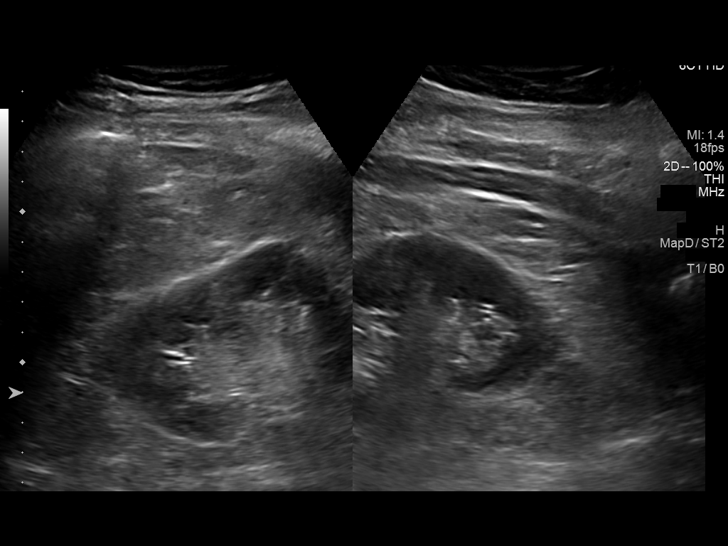
[im 28/42]
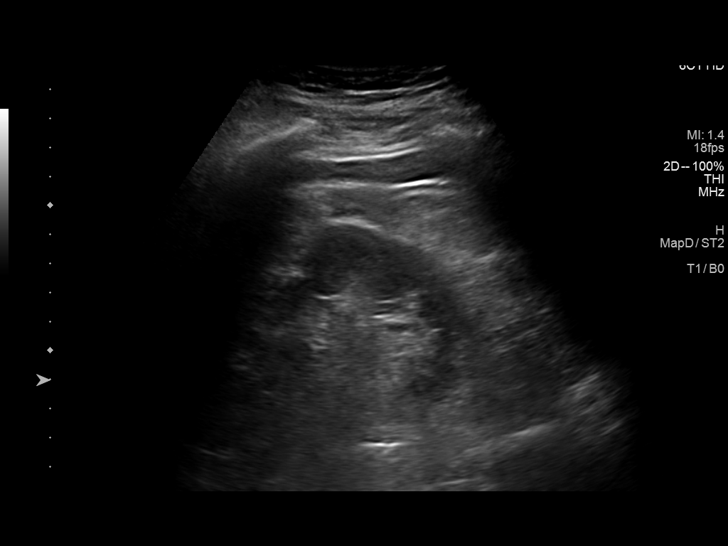
[im 31/42]
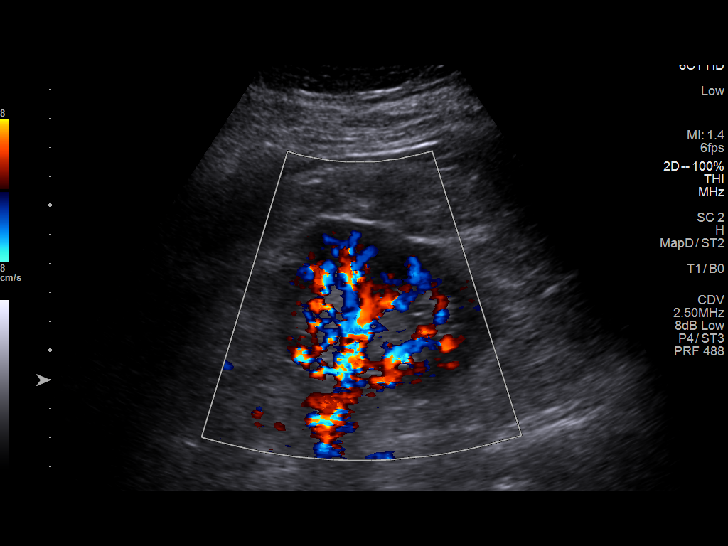
[im 35/42]
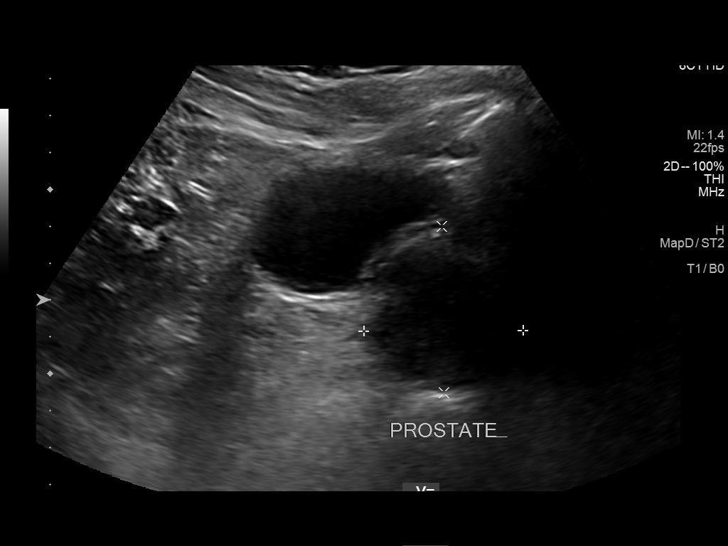
[im 38/42]
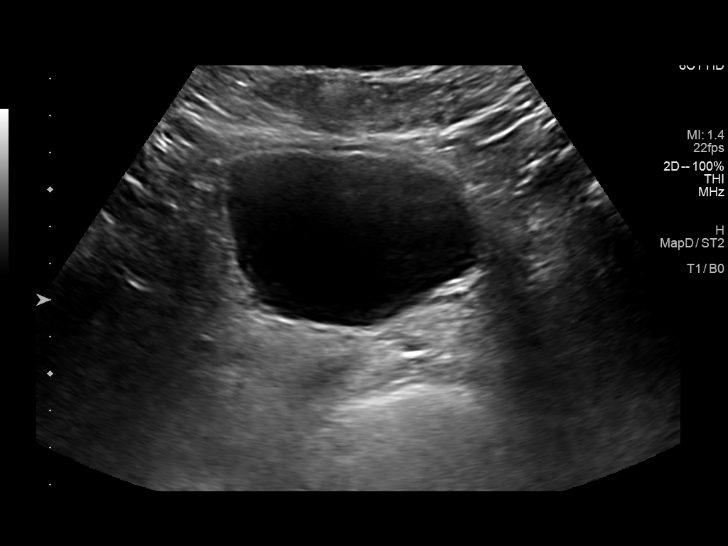
[im 42/42]
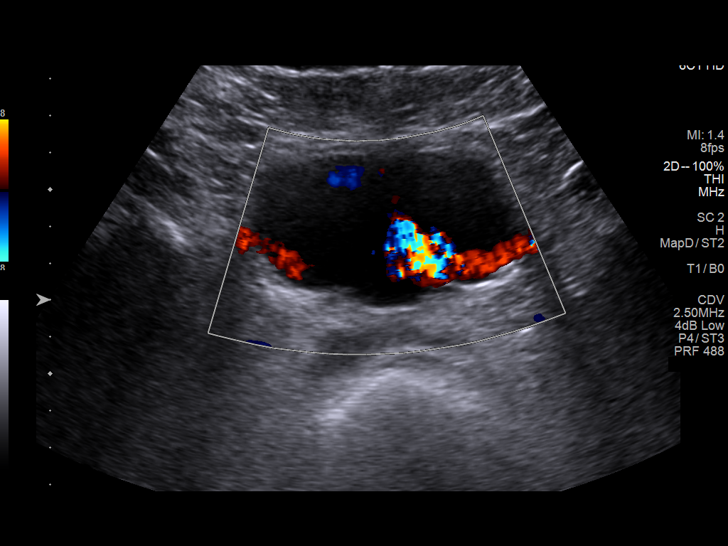

[14 of 25 positions shown; findings below may reference images not displayed]

FINDINGS: Right Kidney:

Renal measurements: 9.4 x 5.6 x 5.3 cm = volume: 148 mL .
Echogenicity within normal limits. No mass or hydronephrosis
visualized.

Left Kidney:

Renal measurements: 9.8 x 5.5 x 5.2 cm = volume: 148 mL.
Echogenicity within normal limits. No mass or hydronephrosis
visualized.

Bladder:

Appears normal for degree of bladder distention. Bilateral ureteral
jets are visualized.
IMPRESSION: Normal renal ultrasound.

## 2019-09-01 DIAGNOSIS — I10 Essential (primary) hypertension: Secondary | ICD-10-CM | POA: Diagnosis not present

## 2019-11-02 DIAGNOSIS — Z139 Encounter for screening, unspecified: Secondary | ICD-10-CM | POA: Diagnosis not present

## 2019-11-02 DIAGNOSIS — Z0000 Encounter for general adult medical examination without abnormal findings: Secondary | ICD-10-CM | POA: Diagnosis not present

## 2019-11-02 DIAGNOSIS — E785 Hyperlipidemia, unspecified: Secondary | ICD-10-CM | POA: Diagnosis not present

## 2019-11-02 DIAGNOSIS — Z9181 History of falling: Secondary | ICD-10-CM | POA: Diagnosis not present

## 2019-11-10 DIAGNOSIS — I10 Essential (primary) hypertension: Secondary | ICD-10-CM | POA: Diagnosis not present

## 2019-11-10 DIAGNOSIS — E785 Hyperlipidemia, unspecified: Secondary | ICD-10-CM | POA: Diagnosis not present

## 2019-11-10 DIAGNOSIS — Z79899 Other long term (current) drug therapy: Secondary | ICD-10-CM | POA: Diagnosis not present

## 2019-11-10 DIAGNOSIS — N184 Chronic kidney disease, stage 4 (severe): Secondary | ICD-10-CM | POA: Diagnosis not present

## 2019-11-10 DIAGNOSIS — E039 Hypothyroidism, unspecified: Secondary | ICD-10-CM | POA: Diagnosis not present

## 2019-11-10 DIAGNOSIS — E1121 Type 2 diabetes mellitus with diabetic nephropathy: Secondary | ICD-10-CM | POA: Diagnosis not present

## 2019-11-16 DIAGNOSIS — L719 Rosacea, unspecified: Secondary | ICD-10-CM | POA: Diagnosis not present

## 2019-12-22 DIAGNOSIS — R001 Bradycardia, unspecified: Secondary | ICD-10-CM | POA: Diagnosis not present

## 2019-12-22 DIAGNOSIS — I10 Essential (primary) hypertension: Secondary | ICD-10-CM | POA: Diagnosis not present

## 2019-12-22 DIAGNOSIS — R0600 Dyspnea, unspecified: Secondary | ICD-10-CM | POA: Diagnosis not present

## 2020-01-24 DIAGNOSIS — J45901 Unspecified asthma with (acute) exacerbation: Secondary | ICD-10-CM | POA: Diagnosis not present

## 2020-01-24 DIAGNOSIS — Z79899 Other long term (current) drug therapy: Secondary | ICD-10-CM | POA: Diagnosis not present

## 2020-02-28 DIAGNOSIS — H401133 Primary open-angle glaucoma, bilateral, severe stage: Secondary | ICD-10-CM | POA: Diagnosis not present

## 2020-03-06 DIAGNOSIS — H401133 Primary open-angle glaucoma, bilateral, severe stage: Secondary | ICD-10-CM | POA: Diagnosis not present

## 2020-03-22 DIAGNOSIS — E039 Hypothyroidism, unspecified: Secondary | ICD-10-CM | POA: Diagnosis not present

## 2020-03-22 DIAGNOSIS — E1121 Type 2 diabetes mellitus with diabetic nephropathy: Secondary | ICD-10-CM | POA: Diagnosis not present

## 2020-03-22 DIAGNOSIS — N184 Chronic kidney disease, stage 4 (severe): Secondary | ICD-10-CM | POA: Diagnosis not present

## 2020-03-22 DIAGNOSIS — R0600 Dyspnea, unspecified: Secondary | ICD-10-CM | POA: Diagnosis not present

## 2020-03-22 DIAGNOSIS — E785 Hyperlipidemia, unspecified: Secondary | ICD-10-CM | POA: Diagnosis not present

## 2020-03-22 DIAGNOSIS — I10 Essential (primary) hypertension: Secondary | ICD-10-CM | POA: Diagnosis not present

## 2020-03-28 DIAGNOSIS — N184 Chronic kidney disease, stage 4 (severe): Secondary | ICD-10-CM | POA: Diagnosis not present

## 2020-04-03 DIAGNOSIS — E559 Vitamin D deficiency, unspecified: Secondary | ICD-10-CM | POA: Diagnosis not present

## 2020-04-03 DIAGNOSIS — I129 Hypertensive chronic kidney disease with stage 1 through stage 4 chronic kidney disease, or unspecified chronic kidney disease: Secondary | ICD-10-CM | POA: Diagnosis not present

## 2020-04-03 DIAGNOSIS — N184 Chronic kidney disease, stage 4 (severe): Secondary | ICD-10-CM | POA: Diagnosis not present

## 2020-04-03 DIAGNOSIS — E875 Hyperkalemia: Secondary | ICD-10-CM | POA: Diagnosis not present

## 2020-05-08 DIAGNOSIS — L299 Pruritus, unspecified: Secondary | ICD-10-CM | POA: Diagnosis not present

## 2020-05-08 DIAGNOSIS — R21 Rash and other nonspecific skin eruption: Secondary | ICD-10-CM | POA: Diagnosis not present

## 2020-05-12 DIAGNOSIS — H401133 Primary open-angle glaucoma, bilateral, severe stage: Secondary | ICD-10-CM | POA: Diagnosis not present

## 2020-06-19 DIAGNOSIS — H35353 Cystoid macular degeneration, bilateral: Secondary | ICD-10-CM | POA: Diagnosis not present

## 2020-06-28 DIAGNOSIS — R5383 Other fatigue: Secondary | ICD-10-CM | POA: Diagnosis not present

## 2020-06-28 DIAGNOSIS — R0600 Dyspnea, unspecified: Secondary | ICD-10-CM | POA: Diagnosis not present

## 2020-06-28 DIAGNOSIS — I10 Essential (primary) hypertension: Secondary | ICD-10-CM | POA: Diagnosis not present

## 2020-06-28 DIAGNOSIS — R001 Bradycardia, unspecified: Secondary | ICD-10-CM | POA: Diagnosis not present

## 2020-06-28 DIAGNOSIS — R5381 Other malaise: Secondary | ICD-10-CM | POA: Diagnosis not present

## 2020-07-17 DIAGNOSIS — R0600 Dyspnea, unspecified: Secondary | ICD-10-CM | POA: Diagnosis not present

## 2020-07-17 DIAGNOSIS — I10 Essential (primary) hypertension: Secondary | ICD-10-CM | POA: Diagnosis not present

## 2020-07-21 DIAGNOSIS — H401133 Primary open-angle glaucoma, bilateral, severe stage: Secondary | ICD-10-CM | POA: Diagnosis not present

## 2020-07-26 DIAGNOSIS — Z79899 Other long term (current) drug therapy: Secondary | ICD-10-CM | POA: Diagnosis not present

## 2020-07-26 DIAGNOSIS — E039 Hypothyroidism, unspecified: Secondary | ICD-10-CM | POA: Diagnosis not present

## 2020-07-26 DIAGNOSIS — E785 Hyperlipidemia, unspecified: Secondary | ICD-10-CM | POA: Diagnosis not present

## 2020-07-26 DIAGNOSIS — E1121 Type 2 diabetes mellitus with diabetic nephropathy: Secondary | ICD-10-CM | POA: Diagnosis not present

## 2020-07-26 DIAGNOSIS — N184 Chronic kidney disease, stage 4 (severe): Secondary | ICD-10-CM | POA: Diagnosis not present

## 2020-07-26 DIAGNOSIS — I10 Essential (primary) hypertension: Secondary | ICD-10-CM | POA: Diagnosis not present

## 2020-08-15 DIAGNOSIS — R0600 Dyspnea, unspecified: Secondary | ICD-10-CM | POA: Diagnosis not present

## 2020-09-06 DIAGNOSIS — J0100 Acute maxillary sinusitis, unspecified: Secondary | ICD-10-CM | POA: Diagnosis not present

## 2020-09-06 DIAGNOSIS — R059 Cough, unspecified: Secondary | ICD-10-CM | POA: Diagnosis not present

## 2020-09-07 ENCOUNTER — Ambulatory Visit: Payer: Medicare Other | Admitting: Dermatology

## 2020-10-04 DIAGNOSIS — R5381 Other malaise: Secondary | ICD-10-CM | POA: Diagnosis not present

## 2020-10-04 DIAGNOSIS — R001 Bradycardia, unspecified: Secondary | ICD-10-CM | POA: Diagnosis not present

## 2020-10-04 DIAGNOSIS — I10 Essential (primary) hypertension: Secondary | ICD-10-CM | POA: Diagnosis not present

## 2020-10-04 DIAGNOSIS — R0600 Dyspnea, unspecified: Secondary | ICD-10-CM | POA: Diagnosis not present

## 2020-10-04 DIAGNOSIS — R5383 Other fatigue: Secondary | ICD-10-CM | POA: Diagnosis not present

## 2020-10-09 DIAGNOSIS — L821 Other seborrheic keratosis: Secondary | ICD-10-CM | POA: Diagnosis not present

## 2020-10-09 DIAGNOSIS — L570 Actinic keratosis: Secondary | ICD-10-CM | POA: Diagnosis not present

## 2020-10-09 DIAGNOSIS — L578 Other skin changes due to chronic exposure to nonionizing radiation: Secondary | ICD-10-CM | POA: Diagnosis not present

## 2020-10-09 DIAGNOSIS — D485 Neoplasm of uncertain behavior of skin: Secondary | ICD-10-CM | POA: Diagnosis not present

## 2020-10-14 DIAGNOSIS — Z20822 Contact with and (suspected) exposure to covid-19: Secondary | ICD-10-CM | POA: Diagnosis not present

## 2020-11-03 DIAGNOSIS — Z0000 Encounter for general adult medical examination without abnormal findings: Secondary | ICD-10-CM | POA: Diagnosis not present

## 2020-11-03 DIAGNOSIS — Z9181 History of falling: Secondary | ICD-10-CM | POA: Diagnosis not present

## 2020-11-03 DIAGNOSIS — E785 Hyperlipidemia, unspecified: Secondary | ICD-10-CM | POA: Diagnosis not present

## 2020-11-10 DIAGNOSIS — H401133 Primary open-angle glaucoma, bilateral, severe stage: Secondary | ICD-10-CM | POA: Diagnosis not present

## 2020-11-22 DIAGNOSIS — Z79899 Other long term (current) drug therapy: Secondary | ICD-10-CM | POA: Diagnosis not present

## 2020-11-22 DIAGNOSIS — E785 Hyperlipidemia, unspecified: Secondary | ICD-10-CM | POA: Diagnosis not present

## 2020-11-22 DIAGNOSIS — N184 Chronic kidney disease, stage 4 (severe): Secondary | ICD-10-CM | POA: Diagnosis not present

## 2020-11-22 DIAGNOSIS — I10 Essential (primary) hypertension: Secondary | ICD-10-CM | POA: Diagnosis not present

## 2020-11-22 DIAGNOSIS — E039 Hypothyroidism, unspecified: Secondary | ICD-10-CM | POA: Diagnosis not present

## 2020-11-22 DIAGNOSIS — E1121 Type 2 diabetes mellitus with diabetic nephropathy: Secondary | ICD-10-CM | POA: Diagnosis not present

## 2020-12-06 ENCOUNTER — Ambulatory Visit: Payer: Medicare Other | Admitting: Dermatology

## 2021-02-20 DIAGNOSIS — Z87828 Personal history of other (healed) physical injury and trauma: Secondary | ICD-10-CM | POA: Diagnosis not present

## 2021-02-20 DIAGNOSIS — W57XXXA Bitten or stung by nonvenomous insect and other nonvenomous arthropods, initial encounter: Secondary | ICD-10-CM | POA: Diagnosis not present

## 2021-02-26 DIAGNOSIS — L578 Other skin changes due to chronic exposure to nonionizing radiation: Secondary | ICD-10-CM | POA: Diagnosis not present

## 2021-02-26 DIAGNOSIS — L570 Actinic keratosis: Secondary | ICD-10-CM | POA: Diagnosis not present

## 2021-02-26 DIAGNOSIS — L300 Nummular dermatitis: Secondary | ICD-10-CM | POA: Diagnosis not present

## 2021-03-16 DIAGNOSIS — H401133 Primary open-angle glaucoma, bilateral, severe stage: Secondary | ICD-10-CM | POA: Diagnosis not present

## 2021-03-27 DIAGNOSIS — E039 Hypothyroidism, unspecified: Secondary | ICD-10-CM | POA: Diagnosis not present

## 2021-03-27 DIAGNOSIS — Z79899 Other long term (current) drug therapy: Secondary | ICD-10-CM | POA: Diagnosis not present

## 2021-03-27 DIAGNOSIS — I10 Essential (primary) hypertension: Secondary | ICD-10-CM | POA: Diagnosis not present

## 2021-03-27 DIAGNOSIS — E785 Hyperlipidemia, unspecified: Secondary | ICD-10-CM | POA: Diagnosis not present

## 2021-03-27 DIAGNOSIS — E1121 Type 2 diabetes mellitus with diabetic nephropathy: Secondary | ICD-10-CM | POA: Diagnosis not present

## 2021-03-27 DIAGNOSIS — M19049 Primary osteoarthritis, unspecified hand: Secondary | ICD-10-CM | POA: Diagnosis not present

## 2021-03-27 DIAGNOSIS — N184 Chronic kidney disease, stage 4 (severe): Secondary | ICD-10-CM | POA: Diagnosis not present

## 2021-03-27 DIAGNOSIS — Z139 Encounter for screening, unspecified: Secondary | ICD-10-CM | POA: Diagnosis not present

## 2021-04-16 DIAGNOSIS — H43392 Other vitreous opacities, left eye: Secondary | ICD-10-CM | POA: Diagnosis not present

## 2021-04-16 DIAGNOSIS — H44433 Hypotony of eye due to other ocular disorders, bilateral: Secondary | ICD-10-CM | POA: Diagnosis not present

## 2021-04-16 DIAGNOSIS — H31103 Choroidal degeneration, unspecified, bilateral: Secondary | ICD-10-CM | POA: Diagnosis not present

## 2021-04-16 DIAGNOSIS — H43813 Vitreous degeneration, bilateral: Secondary | ICD-10-CM | POA: Diagnosis not present

## 2021-04-25 DIAGNOSIS — N184 Chronic kidney disease, stage 4 (severe): Secondary | ICD-10-CM | POA: Diagnosis not present

## 2021-05-02 DIAGNOSIS — E875 Hyperkalemia: Secondary | ICD-10-CM | POA: Diagnosis not present

## 2021-05-02 DIAGNOSIS — N184 Chronic kidney disease, stage 4 (severe): Secondary | ICD-10-CM | POA: Diagnosis not present

## 2021-05-02 DIAGNOSIS — E559 Vitamin D deficiency, unspecified: Secondary | ICD-10-CM | POA: Diagnosis not present

## 2021-05-02 DIAGNOSIS — E871 Hypo-osmolality and hyponatremia: Secondary | ICD-10-CM | POA: Diagnosis not present

## 2021-05-02 DIAGNOSIS — I129 Hypertensive chronic kidney disease with stage 1 through stage 4 chronic kidney disease, or unspecified chronic kidney disease: Secondary | ICD-10-CM | POA: Diagnosis not present

## 2021-06-01 DIAGNOSIS — E1122 Type 2 diabetes mellitus with diabetic chronic kidney disease: Secondary | ICD-10-CM | POA: Diagnosis not present

## 2021-06-01 DIAGNOSIS — I10 Essential (primary) hypertension: Secondary | ICD-10-CM | POA: Diagnosis not present

## 2021-07-19 DIAGNOSIS — H44433 Hypotony of eye due to other ocular disorders, bilateral: Secondary | ICD-10-CM | POA: Diagnosis not present

## 2021-07-19 DIAGNOSIS — H43813 Vitreous degeneration, bilateral: Secondary | ICD-10-CM | POA: Diagnosis not present

## 2021-07-19 DIAGNOSIS — H31103 Choroidal degeneration, unspecified, bilateral: Secondary | ICD-10-CM | POA: Diagnosis not present

## 2021-07-19 DIAGNOSIS — H43392 Other vitreous opacities, left eye: Secondary | ICD-10-CM | POA: Diagnosis not present

## 2021-07-20 DIAGNOSIS — H401133 Primary open-angle glaucoma, bilateral, severe stage: Secondary | ICD-10-CM | POA: Diagnosis not present

## 2021-07-26 DIAGNOSIS — N184 Chronic kidney disease, stage 4 (severe): Secondary | ICD-10-CM | POA: Diagnosis not present

## 2021-07-30 DIAGNOSIS — E875 Hyperkalemia: Secondary | ICD-10-CM | POA: Diagnosis not present

## 2021-07-30 DIAGNOSIS — E871 Hypo-osmolality and hyponatremia: Secondary | ICD-10-CM | POA: Diagnosis not present

## 2021-07-30 DIAGNOSIS — N1832 Chronic kidney disease, stage 3b: Secondary | ICD-10-CM | POA: Diagnosis not present

## 2021-07-30 DIAGNOSIS — E559 Vitamin D deficiency, unspecified: Secondary | ICD-10-CM | POA: Diagnosis not present

## 2021-07-30 DIAGNOSIS — I129 Hypertensive chronic kidney disease with stage 1 through stage 4 chronic kidney disease, or unspecified chronic kidney disease: Secondary | ICD-10-CM | POA: Diagnosis not present

## 2021-07-31 DIAGNOSIS — E785 Hyperlipidemia, unspecified: Secondary | ICD-10-CM | POA: Diagnosis not present

## 2021-07-31 DIAGNOSIS — M19049 Primary osteoarthritis, unspecified hand: Secondary | ICD-10-CM | POA: Diagnosis not present

## 2021-07-31 DIAGNOSIS — N184 Chronic kidney disease, stage 4 (severe): Secondary | ICD-10-CM | POA: Diagnosis not present

## 2021-07-31 DIAGNOSIS — Z79899 Other long term (current) drug therapy: Secondary | ICD-10-CM | POA: Diagnosis not present

## 2021-07-31 DIAGNOSIS — E039 Hypothyroidism, unspecified: Secondary | ICD-10-CM | POA: Diagnosis not present

## 2021-07-31 DIAGNOSIS — I10 Essential (primary) hypertension: Secondary | ICD-10-CM | POA: Diagnosis not present

## 2021-07-31 DIAGNOSIS — R748 Abnormal levels of other serum enzymes: Secondary | ICD-10-CM | POA: Diagnosis not present

## 2021-07-31 DIAGNOSIS — E1122 Type 2 diabetes mellitus with diabetic chronic kidney disease: Secondary | ICD-10-CM | POA: Diagnosis not present

## 2021-08-10 DIAGNOSIS — R748 Abnormal levels of other serum enzymes: Secondary | ICD-10-CM | POA: Diagnosis not present

## 2021-08-27 DIAGNOSIS — L578 Other skin changes due to chronic exposure to nonionizing radiation: Secondary | ICD-10-CM | POA: Diagnosis not present

## 2021-08-27 DIAGNOSIS — L821 Other seborrheic keratosis: Secondary | ICD-10-CM | POA: Diagnosis not present

## 2021-08-27 DIAGNOSIS — L820 Inflamed seborrheic keratosis: Secondary | ICD-10-CM | POA: Diagnosis not present

## 2021-08-27 DIAGNOSIS — L570 Actinic keratosis: Secondary | ICD-10-CM | POA: Diagnosis not present

## 2021-09-07 DIAGNOSIS — E1122 Type 2 diabetes mellitus with diabetic chronic kidney disease: Secondary | ICD-10-CM | POA: Diagnosis not present

## 2021-09-07 DIAGNOSIS — I10 Essential (primary) hypertension: Secondary | ICD-10-CM | POA: Diagnosis not present

## 2021-10-05 DIAGNOSIS — I10 Essential (primary) hypertension: Secondary | ICD-10-CM | POA: Diagnosis not present

## 2021-10-05 DIAGNOSIS — I358 Other nonrheumatic aortic valve disorders: Secondary | ICD-10-CM | POA: Diagnosis not present

## 2021-10-05 DIAGNOSIS — I351 Nonrheumatic aortic (valve) insufficiency: Secondary | ICD-10-CM | POA: Diagnosis not present

## 2021-11-06 DIAGNOSIS — Z0000 Encounter for general adult medical examination without abnormal findings: Secondary | ICD-10-CM | POA: Diagnosis not present

## 2021-11-06 DIAGNOSIS — E785 Hyperlipidemia, unspecified: Secondary | ICD-10-CM | POA: Diagnosis not present

## 2021-11-06 DIAGNOSIS — Z9181 History of falling: Secondary | ICD-10-CM | POA: Diagnosis not present

## 2021-12-21 DIAGNOSIS — H401133 Primary open-angle glaucoma, bilateral, severe stage: Secondary | ICD-10-CM | POA: Diagnosis not present

## 2022-01-17 DIAGNOSIS — H31103 Choroidal degeneration, unspecified, bilateral: Secondary | ICD-10-CM | POA: Diagnosis not present

## 2022-01-17 DIAGNOSIS — H43392 Other vitreous opacities, left eye: Secondary | ICD-10-CM | POA: Diagnosis not present

## 2022-01-17 DIAGNOSIS — H44433 Hypotony of eye due to other ocular disorders, bilateral: Secondary | ICD-10-CM | POA: Diagnosis not present

## 2022-01-17 DIAGNOSIS — H43813 Vitreous degeneration, bilateral: Secondary | ICD-10-CM | POA: Diagnosis not present

## 2022-01-21 DIAGNOSIS — N184 Chronic kidney disease, stage 4 (severe): Secondary | ICD-10-CM | POA: Diagnosis not present

## 2022-01-22 DIAGNOSIS — Z961 Presence of intraocular lens: Secondary | ICD-10-CM | POA: Diagnosis not present

## 2022-01-30 DIAGNOSIS — E039 Hypothyroidism, unspecified: Secondary | ICD-10-CM | POA: Diagnosis not present

## 2022-01-30 DIAGNOSIS — E785 Hyperlipidemia, unspecified: Secondary | ICD-10-CM | POA: Diagnosis not present

## 2022-01-30 DIAGNOSIS — M19049 Primary osteoarthritis, unspecified hand: Secondary | ICD-10-CM | POA: Diagnosis not present

## 2022-01-30 DIAGNOSIS — N184 Chronic kidney disease, stage 4 (severe): Secondary | ICD-10-CM | POA: Diagnosis not present

## 2022-01-30 DIAGNOSIS — R7989 Other specified abnormal findings of blood chemistry: Secondary | ICD-10-CM | POA: Diagnosis not present

## 2022-01-30 DIAGNOSIS — I10 Essential (primary) hypertension: Secondary | ICD-10-CM | POA: Diagnosis not present

## 2022-01-30 DIAGNOSIS — E1122 Type 2 diabetes mellitus with diabetic chronic kidney disease: Secondary | ICD-10-CM | POA: Diagnosis not present

## 2022-01-31 DIAGNOSIS — N1832 Chronic kidney disease, stage 3b: Secondary | ICD-10-CM | POA: Diagnosis not present

## 2022-01-31 DIAGNOSIS — E871 Hypo-osmolality and hyponatremia: Secondary | ICD-10-CM | POA: Diagnosis not present

## 2022-01-31 DIAGNOSIS — E875 Hyperkalemia: Secondary | ICD-10-CM | POA: Diagnosis not present

## 2022-01-31 DIAGNOSIS — E559 Vitamin D deficiency, unspecified: Secondary | ICD-10-CM | POA: Diagnosis not present

## 2022-01-31 DIAGNOSIS — I129 Hypertensive chronic kidney disease with stage 1 through stage 4 chronic kidney disease, or unspecified chronic kidney disease: Secondary | ICD-10-CM | POA: Diagnosis not present

## 2022-02-25 DIAGNOSIS — L578 Other skin changes due to chronic exposure to nonionizing radiation: Secondary | ICD-10-CM | POA: Diagnosis not present

## 2022-02-25 DIAGNOSIS — L570 Actinic keratosis: Secondary | ICD-10-CM | POA: Diagnosis not present

## 2022-02-25 DIAGNOSIS — L821 Other seborrheic keratosis: Secondary | ICD-10-CM | POA: Diagnosis not present

## 2022-02-25 DIAGNOSIS — R233 Spontaneous ecchymoses: Secondary | ICD-10-CM | POA: Diagnosis not present

## 2022-04-01 DIAGNOSIS — H31103 Choroidal degeneration, unspecified, bilateral: Secondary | ICD-10-CM | POA: Diagnosis not present

## 2022-04-01 DIAGNOSIS — H43813 Vitreous degeneration, bilateral: Secondary | ICD-10-CM | POA: Diagnosis not present

## 2022-04-01 DIAGNOSIS — H33193 Other retinoschisis and retinal cysts, bilateral: Secondary | ICD-10-CM | POA: Diagnosis not present

## 2022-04-01 DIAGNOSIS — H44433 Hypotony of eye due to other ocular disorders, bilateral: Secondary | ICD-10-CM | POA: Diagnosis not present

## 2022-04-12 DIAGNOSIS — H401133 Primary open-angle glaucoma, bilateral, severe stage: Secondary | ICD-10-CM | POA: Diagnosis not present

## 2022-04-24 DIAGNOSIS — E039 Hypothyroidism, unspecified: Secondary | ICD-10-CM | POA: Diagnosis not present

## 2022-04-24 DIAGNOSIS — Z8616 Personal history of COVID-19: Secondary | ICD-10-CM | POA: Diagnosis not present

## 2022-04-24 DIAGNOSIS — I6389 Other cerebral infarction: Secondary | ICD-10-CM | POA: Diagnosis not present

## 2022-04-24 DIAGNOSIS — I129 Hypertensive chronic kidney disease with stage 1 through stage 4 chronic kidney disease, or unspecified chronic kidney disease: Secondary | ICD-10-CM | POA: Diagnosis not present

## 2022-04-24 DIAGNOSIS — R5381 Other malaise: Secondary | ICD-10-CM | POA: Diagnosis not present

## 2022-04-24 DIAGNOSIS — Z880 Allergy status to penicillin: Secondary | ICD-10-CM | POA: Diagnosis not present

## 2022-04-24 DIAGNOSIS — Z87891 Personal history of nicotine dependence: Secondary | ICD-10-CM | POA: Diagnosis not present

## 2022-04-24 DIAGNOSIS — R55 Syncope and collapse: Secondary | ICD-10-CM | POA: Diagnosis not present

## 2022-04-24 DIAGNOSIS — R531 Weakness: Secondary | ICD-10-CM | POA: Diagnosis not present

## 2022-04-24 DIAGNOSIS — N183 Chronic kidney disease, stage 3 unspecified: Secondary | ICD-10-CM | POA: Diagnosis not present

## 2022-04-24 DIAGNOSIS — R270 Ataxia, unspecified: Secondary | ICD-10-CM | POA: Diagnosis not present

## 2022-04-24 DIAGNOSIS — M1990 Unspecified osteoarthritis, unspecified site: Secondary | ICD-10-CM | POA: Diagnosis not present

## 2022-04-24 DIAGNOSIS — R2681 Unsteadiness on feet: Secondary | ICD-10-CM | POA: Diagnosis not present

## 2022-04-24 DIAGNOSIS — Z7984 Long term (current) use of oral hypoglycemic drugs: Secondary | ICD-10-CM | POA: Diagnosis not present

## 2022-04-24 DIAGNOSIS — Z885 Allergy status to narcotic agent status: Secondary | ICD-10-CM | POA: Diagnosis not present

## 2022-04-24 DIAGNOSIS — E1122 Type 2 diabetes mellitus with diabetic chronic kidney disease: Secondary | ICD-10-CM | POA: Diagnosis not present

## 2022-04-24 DIAGNOSIS — Z79899 Other long term (current) drug therapy: Secondary | ICD-10-CM | POA: Diagnosis not present

## 2022-04-24 DIAGNOSIS — Z7982 Long term (current) use of aspirin: Secondary | ICD-10-CM | POA: Diagnosis not present

## 2022-04-30 DIAGNOSIS — Z87898 Personal history of other specified conditions: Secondary | ICD-10-CM | POA: Diagnosis not present

## 2022-04-30 DIAGNOSIS — Z09 Encounter for follow-up examination after completed treatment for conditions other than malignant neoplasm: Secondary | ICD-10-CM | POA: Diagnosis not present

## 2022-05-09 DIAGNOSIS — I10 Essential (primary) hypertension: Secondary | ICD-10-CM | POA: Diagnosis not present

## 2022-05-09 DIAGNOSIS — N184 Chronic kidney disease, stage 4 (severe): Secondary | ICD-10-CM | POA: Diagnosis not present

## 2022-06-27 DIAGNOSIS — L821 Other seborrheic keratosis: Secondary | ICD-10-CM | POA: Diagnosis not present

## 2022-06-27 DIAGNOSIS — L578 Other skin changes due to chronic exposure to nonionizing radiation: Secondary | ICD-10-CM | POA: Diagnosis not present

## 2022-06-27 DIAGNOSIS — C44311 Basal cell carcinoma of skin of nose: Secondary | ICD-10-CM | POA: Diagnosis not present

## 2022-06-27 DIAGNOSIS — L570 Actinic keratosis: Secondary | ICD-10-CM | POA: Diagnosis not present

## 2022-08-05 DIAGNOSIS — H44433 Hypotony of eye due to other ocular disorders, bilateral: Secondary | ICD-10-CM | POA: Diagnosis not present

## 2022-08-05 DIAGNOSIS — H31103 Choroidal degeneration, unspecified, bilateral: Secondary | ICD-10-CM | POA: Diagnosis not present

## 2022-08-05 DIAGNOSIS — H33193 Other retinoschisis and retinal cysts, bilateral: Secondary | ICD-10-CM | POA: Diagnosis not present

## 2022-08-05 DIAGNOSIS — H43813 Vitreous degeneration, bilateral: Secondary | ICD-10-CM | POA: Diagnosis not present

## 2022-08-06 DIAGNOSIS — N184 Chronic kidney disease, stage 4 (severe): Secondary | ICD-10-CM | POA: Diagnosis not present

## 2022-08-06 DIAGNOSIS — E039 Hypothyroidism, unspecified: Secondary | ICD-10-CM | POA: Diagnosis not present

## 2022-08-06 DIAGNOSIS — Z79899 Other long term (current) drug therapy: Secondary | ICD-10-CM | POA: Diagnosis not present

## 2022-08-06 DIAGNOSIS — E1122 Type 2 diabetes mellitus with diabetic chronic kidney disease: Secondary | ICD-10-CM | POA: Diagnosis not present

## 2022-08-06 DIAGNOSIS — R7989 Other specified abnormal findings of blood chemistry: Secondary | ICD-10-CM | POA: Diagnosis not present

## 2022-08-06 DIAGNOSIS — E785 Hyperlipidemia, unspecified: Secondary | ICD-10-CM | POA: Diagnosis not present

## 2022-08-06 DIAGNOSIS — I10 Essential (primary) hypertension: Secondary | ICD-10-CM | POA: Diagnosis not present

## 2022-08-15 DIAGNOSIS — H26491 Other secondary cataract, right eye: Secondary | ICD-10-CM | POA: Diagnosis not present

## 2022-08-26 DIAGNOSIS — C44311 Basal cell carcinoma of skin of nose: Secondary | ICD-10-CM | POA: Diagnosis not present

## 2022-08-26 DIAGNOSIS — L570 Actinic keratosis: Secondary | ICD-10-CM | POA: Diagnosis not present

## 2022-09-05 DIAGNOSIS — H26492 Other secondary cataract, left eye: Secondary | ICD-10-CM | POA: Diagnosis not present

## 2022-10-04 DIAGNOSIS — R0789 Other chest pain: Secondary | ICD-10-CM | POA: Diagnosis not present

## 2022-10-04 DIAGNOSIS — I358 Other nonrheumatic aortic valve disorders: Secondary | ICD-10-CM | POA: Diagnosis not present

## 2022-10-04 DIAGNOSIS — I351 Nonrheumatic aortic (valve) insufficiency: Secondary | ICD-10-CM | POA: Diagnosis not present

## 2022-10-04 DIAGNOSIS — R55 Syncope and collapse: Secondary | ICD-10-CM | POA: Diagnosis not present

## 2022-10-04 DIAGNOSIS — R0609 Other forms of dyspnea: Secondary | ICD-10-CM | POA: Diagnosis not present

## 2022-10-04 DIAGNOSIS — I10 Essential (primary) hypertension: Secondary | ICD-10-CM | POA: Diagnosis not present

## 2022-10-10 DIAGNOSIS — L570 Actinic keratosis: Secondary | ICD-10-CM | POA: Diagnosis not present

## 2022-10-10 DIAGNOSIS — C44311 Basal cell carcinoma of skin of nose: Secondary | ICD-10-CM | POA: Diagnosis not present

## 2022-11-07 DIAGNOSIS — E1122 Type 2 diabetes mellitus with diabetic chronic kidney disease: Secondary | ICD-10-CM | POA: Diagnosis not present

## 2022-11-07 DIAGNOSIS — I10 Essential (primary) hypertension: Secondary | ICD-10-CM | POA: Diagnosis not present

## 2023-01-08 DIAGNOSIS — Z9181 History of falling: Secondary | ICD-10-CM | POA: Diagnosis not present

## 2023-01-08 DIAGNOSIS — Z0000 Encounter for general adult medical examination without abnormal findings: Secondary | ICD-10-CM | POA: Diagnosis not present

## 2023-01-20 DIAGNOSIS — I351 Nonrheumatic aortic (valve) insufficiency: Secondary | ICD-10-CM | POA: Diagnosis not present

## 2023-01-20 DIAGNOSIS — R0609 Other forms of dyspnea: Secondary | ICD-10-CM | POA: Diagnosis not present

## 2023-01-20 DIAGNOSIS — I10 Essential (primary) hypertension: Secondary | ICD-10-CM | POA: Diagnosis not present

## 2023-01-20 DIAGNOSIS — I358 Other nonrheumatic aortic valve disorders: Secondary | ICD-10-CM | POA: Diagnosis not present

## 2023-01-28 DIAGNOSIS — N184 Chronic kidney disease, stage 4 (severe): Secondary | ICD-10-CM | POA: Diagnosis not present

## 2023-02-04 DIAGNOSIS — E1122 Type 2 diabetes mellitus with diabetic chronic kidney disease: Secondary | ICD-10-CM | POA: Diagnosis not present

## 2023-02-04 DIAGNOSIS — R7989 Other specified abnormal findings of blood chemistry: Secondary | ICD-10-CM | POA: Diagnosis not present

## 2023-02-04 DIAGNOSIS — E039 Hypothyroidism, unspecified: Secondary | ICD-10-CM | POA: Diagnosis not present

## 2023-02-04 DIAGNOSIS — N184 Chronic kidney disease, stage 4 (severe): Secondary | ICD-10-CM | POA: Diagnosis not present

## 2023-02-04 DIAGNOSIS — Z139 Encounter for screening, unspecified: Secondary | ICD-10-CM | POA: Diagnosis not present

## 2023-02-04 DIAGNOSIS — E785 Hyperlipidemia, unspecified: Secondary | ICD-10-CM | POA: Diagnosis not present

## 2023-02-04 DIAGNOSIS — M19049 Primary osteoarthritis, unspecified hand: Secondary | ICD-10-CM | POA: Diagnosis not present

## 2023-02-04 DIAGNOSIS — I10 Essential (primary) hypertension: Secondary | ICD-10-CM | POA: Diagnosis not present

## 2023-02-05 DIAGNOSIS — E559 Vitamin D deficiency, unspecified: Secondary | ICD-10-CM | POA: Diagnosis not present

## 2023-02-05 DIAGNOSIS — E875 Hyperkalemia: Secondary | ICD-10-CM | POA: Diagnosis not present

## 2023-02-05 DIAGNOSIS — N1832 Chronic kidney disease, stage 3b: Secondary | ICD-10-CM | POA: Diagnosis not present

## 2023-02-05 DIAGNOSIS — I129 Hypertensive chronic kidney disease with stage 1 through stage 4 chronic kidney disease, or unspecified chronic kidney disease: Secondary | ICD-10-CM | POA: Diagnosis not present

## 2023-02-05 DIAGNOSIS — E871 Hypo-osmolality and hyponatremia: Secondary | ICD-10-CM | POA: Diagnosis not present

## 2023-03-17 DIAGNOSIS — R0609 Other forms of dyspnea: Secondary | ICD-10-CM | POA: Diagnosis not present

## 2023-03-17 DIAGNOSIS — I351 Nonrheumatic aortic (valve) insufficiency: Secondary | ICD-10-CM | POA: Diagnosis not present

## 2023-03-24 DIAGNOSIS — H35353 Cystoid macular degeneration, bilateral: Secondary | ICD-10-CM | POA: Diagnosis not present

## 2023-03-24 DIAGNOSIS — H44423 Hypotony of eye due to ocular fistula, bilateral: Secondary | ICD-10-CM | POA: Diagnosis not present

## 2023-03-24 DIAGNOSIS — Z961 Presence of intraocular lens: Secondary | ICD-10-CM | POA: Diagnosis not present

## 2023-03-27 DIAGNOSIS — H43813 Vitreous degeneration, bilateral: Secondary | ICD-10-CM | POA: Diagnosis not present

## 2023-03-27 DIAGNOSIS — H59033 Cystoid macular edema following cataract surgery, bilateral: Secondary | ICD-10-CM | POA: Diagnosis not present

## 2023-03-27 DIAGNOSIS — H44443 Primary hypotony of eye, bilateral: Secondary | ICD-10-CM | POA: Diagnosis not present

## 2023-03-27 DIAGNOSIS — H318 Other specified disorders of choroid: Secondary | ICD-10-CM | POA: Diagnosis not present

## 2023-04-10 DIAGNOSIS — L821 Other seborrheic keratosis: Secondary | ICD-10-CM | POA: Diagnosis not present

## 2023-04-10 DIAGNOSIS — L570 Actinic keratosis: Secondary | ICD-10-CM | POA: Diagnosis not present

## 2023-04-10 DIAGNOSIS — L578 Other skin changes due to chronic exposure to nonionizing radiation: Secondary | ICD-10-CM | POA: Diagnosis not present

## 2023-04-24 DIAGNOSIS — H43813 Vitreous degeneration, bilateral: Secondary | ICD-10-CM | POA: Diagnosis not present

## 2023-04-24 DIAGNOSIS — H318 Other specified disorders of choroid: Secondary | ICD-10-CM | POA: Diagnosis not present

## 2023-04-24 DIAGNOSIS — H44443 Primary hypotony of eye, bilateral: Secondary | ICD-10-CM | POA: Diagnosis not present

## 2023-06-06 DIAGNOSIS — H44423 Hypotony of eye due to ocular fistula, bilateral: Secondary | ICD-10-CM | POA: Diagnosis not present

## 2023-06-11 DIAGNOSIS — R7989 Other specified abnormal findings of blood chemistry: Secondary | ICD-10-CM | POA: Diagnosis not present

## 2023-06-11 DIAGNOSIS — E1122 Type 2 diabetes mellitus with diabetic chronic kidney disease: Secondary | ICD-10-CM | POA: Diagnosis not present

## 2023-06-11 DIAGNOSIS — E039 Hypothyroidism, unspecified: Secondary | ICD-10-CM | POA: Diagnosis not present

## 2023-06-11 DIAGNOSIS — I10 Essential (primary) hypertension: Secondary | ICD-10-CM | POA: Diagnosis not present

## 2023-06-11 DIAGNOSIS — Z23 Encounter for immunization: Secondary | ICD-10-CM | POA: Diagnosis not present

## 2023-06-11 DIAGNOSIS — E785 Hyperlipidemia, unspecified: Secondary | ICD-10-CM | POA: Diagnosis not present

## 2023-06-11 DIAGNOSIS — N184 Chronic kidney disease, stage 4 (severe): Secondary | ICD-10-CM | POA: Diagnosis not present

## 2023-06-12 DIAGNOSIS — Z87891 Personal history of nicotine dependence: Secondary | ICD-10-CM | POA: Diagnosis not present

## 2023-06-12 DIAGNOSIS — H44422 Hypotony of left eye due to ocular fistula: Secondary | ICD-10-CM | POA: Diagnosis not present

## 2023-06-12 DIAGNOSIS — I129 Hypertensive chronic kidney disease with stage 1 through stage 4 chronic kidney disease, or unspecified chronic kidney disease: Secondary | ICD-10-CM | POA: Diagnosis not present

## 2023-06-12 DIAGNOSIS — N183 Chronic kidney disease, stage 3 unspecified: Secondary | ICD-10-CM | POA: Diagnosis not present

## 2023-06-12 DIAGNOSIS — H401122 Primary open-angle glaucoma, left eye, moderate stage: Secondary | ICD-10-CM | POA: Diagnosis not present

## 2023-06-12 DIAGNOSIS — Z79899 Other long term (current) drug therapy: Secondary | ICD-10-CM | POA: Diagnosis not present

## 2023-06-12 DIAGNOSIS — E039 Hypothyroidism, unspecified: Secondary | ICD-10-CM | POA: Diagnosis not present

## 2023-07-21 DIAGNOSIS — R0609 Other forms of dyspnea: Secondary | ICD-10-CM | POA: Diagnosis not present

## 2023-07-21 DIAGNOSIS — I358 Other nonrheumatic aortic valve disorders: Secondary | ICD-10-CM | POA: Diagnosis not present

## 2023-07-21 DIAGNOSIS — I10 Essential (primary) hypertension: Secondary | ICD-10-CM | POA: Diagnosis not present

## 2023-07-21 DIAGNOSIS — I351 Nonrheumatic aortic (valve) insufficiency: Secondary | ICD-10-CM | POA: Diagnosis not present

## 2023-10-13 DIAGNOSIS — L821 Other seborrheic keratosis: Secondary | ICD-10-CM | POA: Diagnosis not present

## 2023-10-13 DIAGNOSIS — D225 Melanocytic nevi of trunk: Secondary | ICD-10-CM | POA: Diagnosis not present

## 2023-10-14 DIAGNOSIS — R7989 Other specified abnormal findings of blood chemistry: Secondary | ICD-10-CM | POA: Diagnosis not present

## 2023-10-14 DIAGNOSIS — N184 Chronic kidney disease, stage 4 (severe): Secondary | ICD-10-CM | POA: Diagnosis not present

## 2023-10-14 DIAGNOSIS — E039 Hypothyroidism, unspecified: Secondary | ICD-10-CM | POA: Diagnosis not present

## 2023-10-14 DIAGNOSIS — E1122 Type 2 diabetes mellitus with diabetic chronic kidney disease: Secondary | ICD-10-CM | POA: Diagnosis not present

## 2023-10-14 DIAGNOSIS — I10 Essential (primary) hypertension: Secondary | ICD-10-CM | POA: Diagnosis not present

## 2023-10-14 DIAGNOSIS — E785 Hyperlipidemia, unspecified: Secondary | ICD-10-CM | POA: Diagnosis not present

## 2023-10-15 DIAGNOSIS — Z961 Presence of intraocular lens: Secondary | ICD-10-CM | POA: Diagnosis not present

## 2023-10-15 DIAGNOSIS — H401133 Primary open-angle glaucoma, bilateral, severe stage: Secondary | ICD-10-CM | POA: Diagnosis not present

## 2023-10-15 DIAGNOSIS — H44423 Hypotony of eye due to ocular fistula, bilateral: Secondary | ICD-10-CM | POA: Diagnosis not present

## 2023-11-03 DIAGNOSIS — H401123 Primary open-angle glaucoma, left eye, severe stage: Secondary | ICD-10-CM | POA: Diagnosis not present

## 2023-11-20 DIAGNOSIS — H401123 Primary open-angle glaucoma, left eye, severe stage: Secondary | ICD-10-CM | POA: Diagnosis not present

## 2023-11-20 DIAGNOSIS — H44423 Hypotony of eye due to ocular fistula, bilateral: Secondary | ICD-10-CM | POA: Diagnosis not present

## 2023-11-20 DIAGNOSIS — H5460 Unqualified visual loss, one eye, unspecified: Secondary | ICD-10-CM | POA: Diagnosis not present

## 2023-11-20 DIAGNOSIS — H54414A Blindness right eye category 4, normal vision left eye: Secondary | ICD-10-CM | POA: Diagnosis not present

## 2023-11-20 DIAGNOSIS — H5372 Impaired contrast sensitivity: Secondary | ICD-10-CM | POA: Diagnosis not present

## 2023-12-01 DIAGNOSIS — H44423 Hypotony of eye due to ocular fistula, bilateral: Secondary | ICD-10-CM | POA: Diagnosis not present

## 2023-12-01 DIAGNOSIS — H401133 Primary open-angle glaucoma, bilateral, severe stage: Secondary | ICD-10-CM | POA: Diagnosis not present

## 2023-12-01 DIAGNOSIS — H0012 Chalazion right lower eyelid: Secondary | ICD-10-CM | POA: Diagnosis not present

## 2023-12-18 DIAGNOSIS — H44423 Hypotony of eye due to ocular fistula, bilateral: Secondary | ICD-10-CM | POA: Diagnosis not present

## 2023-12-18 DIAGNOSIS — H401133 Primary open-angle glaucoma, bilateral, severe stage: Secondary | ICD-10-CM | POA: Diagnosis not present

## 2024-01-19 DIAGNOSIS — Z0000 Encounter for general adult medical examination without abnormal findings: Secondary | ICD-10-CM | POA: Diagnosis not present

## 2024-01-19 DIAGNOSIS — Z9181 History of falling: Secondary | ICD-10-CM | POA: Diagnosis not present

## 2024-01-22 DIAGNOSIS — H40003 Preglaucoma, unspecified, bilateral: Secondary | ICD-10-CM | POA: Diagnosis not present

## 2024-01-29 DIAGNOSIS — H401133 Primary open-angle glaucoma, bilateral, severe stage: Secondary | ICD-10-CM | POA: Diagnosis not present

## 2024-02-03 DIAGNOSIS — N184 Chronic kidney disease, stage 4 (severe): Secondary | ICD-10-CM | POA: Diagnosis not present

## 2024-02-11 DIAGNOSIS — E785 Hyperlipidemia, unspecified: Secondary | ICD-10-CM | POA: Diagnosis not present

## 2024-02-11 DIAGNOSIS — N184 Chronic kidney disease, stage 4 (severe): Secondary | ICD-10-CM | POA: Diagnosis not present

## 2024-02-11 DIAGNOSIS — E559 Vitamin D deficiency, unspecified: Secondary | ICD-10-CM | POA: Diagnosis not present

## 2024-02-11 DIAGNOSIS — E1122 Type 2 diabetes mellitus with diabetic chronic kidney disease: Secondary | ICD-10-CM | POA: Diagnosis not present

## 2024-02-11 DIAGNOSIS — E039 Hypothyroidism, unspecified: Secondary | ICD-10-CM | POA: Diagnosis not present

## 2024-02-11 DIAGNOSIS — I700 Atherosclerosis of aorta: Secondary | ICD-10-CM | POA: Diagnosis not present

## 2024-02-11 DIAGNOSIS — N1832 Chronic kidney disease, stage 3b: Secondary | ICD-10-CM | POA: Diagnosis not present

## 2024-02-11 DIAGNOSIS — M25512 Pain in left shoulder: Secondary | ICD-10-CM | POA: Diagnosis not present

## 2024-02-11 DIAGNOSIS — I10 Essential (primary) hypertension: Secondary | ICD-10-CM | POA: Diagnosis not present

## 2024-02-11 DIAGNOSIS — I129 Hypertensive chronic kidney disease with stage 1 through stage 4 chronic kidney disease, or unspecified chronic kidney disease: Secondary | ICD-10-CM | POA: Diagnosis not present

## 2024-02-13 DIAGNOSIS — I10 Essential (primary) hypertension: Secondary | ICD-10-CM | POA: Diagnosis not present

## 2024-02-13 DIAGNOSIS — I358 Other nonrheumatic aortic valve disorders: Secondary | ICD-10-CM | POA: Diagnosis not present

## 2024-02-13 DIAGNOSIS — I351 Nonrheumatic aortic (valve) insufficiency: Secondary | ICD-10-CM | POA: Diagnosis not present

## 2024-02-17 DIAGNOSIS — H401123 Primary open-angle glaucoma, left eye, severe stage: Secondary | ICD-10-CM | POA: Diagnosis not present

## 2024-02-17 DIAGNOSIS — H409 Unspecified glaucoma: Secondary | ICD-10-CM | POA: Diagnosis not present

## 2024-02-17 DIAGNOSIS — R0602 Shortness of breath: Secondary | ICD-10-CM | POA: Diagnosis not present

## 2024-02-17 DIAGNOSIS — I351 Nonrheumatic aortic (valve) insufficiency: Secondary | ICD-10-CM | POA: Diagnosis not present

## 2024-02-17 DIAGNOSIS — I10 Essential (primary) hypertension: Secondary | ICD-10-CM | POA: Diagnosis not present

## 2024-02-24 DIAGNOSIS — R059 Cough, unspecified: Secondary | ICD-10-CM | POA: Diagnosis not present

## 2024-02-24 DIAGNOSIS — U071 COVID-19: Secondary | ICD-10-CM | POA: Diagnosis not present

## 2024-02-24 DIAGNOSIS — R6883 Chills (without fever): Secondary | ICD-10-CM | POA: Diagnosis not present

## 2024-02-24 DIAGNOSIS — M791 Myalgia, unspecified site: Secondary | ICD-10-CM | POA: Diagnosis not present

## 2024-03-08 DIAGNOSIS — N184 Chronic kidney disease, stage 4 (severe): Secondary | ICD-10-CM | POA: Diagnosis not present

## 2024-03-08 DIAGNOSIS — E785 Hyperlipidemia, unspecified: Secondary | ICD-10-CM | POA: Diagnosis not present

## 2024-03-08 DIAGNOSIS — I10 Essential (primary) hypertension: Secondary | ICD-10-CM | POA: Diagnosis not present

## 2024-03-08 DIAGNOSIS — E039 Hypothyroidism, unspecified: Secondary | ICD-10-CM | POA: Diagnosis not present

## 2024-03-08 DIAGNOSIS — E1122 Type 2 diabetes mellitus with diabetic chronic kidney disease: Secondary | ICD-10-CM | POA: Diagnosis not present

## 2024-03-11 DIAGNOSIS — Z79899 Other long term (current) drug therapy: Secondary | ICD-10-CM | POA: Diagnosis not present

## 2024-03-11 DIAGNOSIS — M7552 Bursitis of left shoulder: Secondary | ICD-10-CM | POA: Diagnosis not present

## 2024-04-02 DIAGNOSIS — S2221XA Fracture of manubrium, initial encounter for closed fracture: Secondary | ICD-10-CM | POA: Diagnosis not present

## 2024-04-02 DIAGNOSIS — S2220XA Unspecified fracture of sternum, initial encounter for closed fracture: Secondary | ICD-10-CM | POA: Diagnosis not present

## 2024-04-02 DIAGNOSIS — R109 Unspecified abdominal pain: Secondary | ICD-10-CM | POA: Diagnosis not present

## 2024-04-02 DIAGNOSIS — S20219A Contusion of unspecified front wall of thorax, initial encounter: Secondary | ICD-10-CM | POA: Diagnosis not present

## 2024-04-02 DIAGNOSIS — T148XXA Other injury of unspecified body region, initial encounter: Secondary | ICD-10-CM | POA: Diagnosis not present

## 2024-04-02 DIAGNOSIS — S0990XA Unspecified injury of head, initial encounter: Secondary | ICD-10-CM | POA: Diagnosis not present

## 2024-04-02 DIAGNOSIS — S6291XA Unspecified fracture of right wrist and hand, initial encounter for closed fracture: Secondary | ICD-10-CM | POA: Diagnosis not present

## 2024-04-02 DIAGNOSIS — S62642A Nondisplaced fracture of proximal phalanx of right middle finger, initial encounter for closed fracture: Secondary | ICD-10-CM | POA: Diagnosis not present

## 2024-04-02 DIAGNOSIS — S2222XA Fracture of body of sternum, initial encounter for closed fracture: Secondary | ICD-10-CM | POA: Diagnosis not present

## 2024-04-02 DIAGNOSIS — I6523 Occlusion and stenosis of bilateral carotid arteries: Secondary | ICD-10-CM | POA: Diagnosis not present

## 2024-04-02 DIAGNOSIS — S8991XA Unspecified injury of right lower leg, initial encounter: Secondary | ICD-10-CM | POA: Diagnosis not present

## 2024-04-02 DIAGNOSIS — I700 Atherosclerosis of aorta: Secondary | ICD-10-CM | POA: Diagnosis not present

## 2024-04-02 DIAGNOSIS — S62646A Nondisplaced fracture of proximal phalanx of right little finger, initial encounter for closed fracture: Secondary | ICD-10-CM | POA: Diagnosis not present

## 2024-04-02 DIAGNOSIS — I10 Essential (primary) hypertension: Secondary | ICD-10-CM | POA: Diagnosis not present

## 2024-04-02 DIAGNOSIS — S20211A Contusion of right front wall of thorax, initial encounter: Secondary | ICD-10-CM | POA: Diagnosis not present

## 2024-04-02 DIAGNOSIS — R079 Chest pain, unspecified: Secondary | ICD-10-CM | POA: Diagnosis not present

## 2024-04-02 DIAGNOSIS — S199XXA Unspecified injury of neck, initial encounter: Secondary | ICD-10-CM | POA: Diagnosis not present

## 2024-04-02 DIAGNOSIS — S62640A Nondisplaced fracture of proximal phalanx of right index finger, initial encounter for closed fracture: Secondary | ICD-10-CM | POA: Diagnosis not present

## 2024-04-03 DIAGNOSIS — E039 Hypothyroidism, unspecified: Secondary | ICD-10-CM | POA: Diagnosis not present

## 2024-04-03 DIAGNOSIS — N183 Chronic kidney disease, stage 3 unspecified: Secondary | ICD-10-CM | POA: Diagnosis not present

## 2024-04-03 DIAGNOSIS — Z79899 Other long term (current) drug therapy: Secondary | ICD-10-CM | POA: Diagnosis not present

## 2024-04-03 DIAGNOSIS — S20219A Contusion of unspecified front wall of thorax, initial encounter: Secondary | ICD-10-CM | POA: Diagnosis not present

## 2024-04-03 DIAGNOSIS — S6291XA Unspecified fracture of right wrist and hand, initial encounter for closed fracture: Secondary | ICD-10-CM | POA: Diagnosis not present

## 2024-04-03 DIAGNOSIS — S62610A Displaced fracture of proximal phalanx of right index finger, initial encounter for closed fracture: Secondary | ICD-10-CM | POA: Diagnosis not present

## 2024-04-03 DIAGNOSIS — S62612A Displaced fracture of proximal phalanx of right middle finger, initial encounter for closed fracture: Secondary | ICD-10-CM | POA: Diagnosis not present

## 2024-04-03 DIAGNOSIS — M546 Pain in thoracic spine: Secondary | ICD-10-CM | POA: Diagnosis not present

## 2024-04-03 DIAGNOSIS — S301XXA Contusion of abdominal wall, initial encounter: Secondary | ICD-10-CM | POA: Diagnosis not present

## 2024-04-03 DIAGNOSIS — Z7982 Long term (current) use of aspirin: Secondary | ICD-10-CM | POA: Diagnosis not present

## 2024-04-03 DIAGNOSIS — S2221XA Fracture of manubrium, initial encounter for closed fracture: Secondary | ICD-10-CM | POA: Diagnosis not present

## 2024-04-03 DIAGNOSIS — S2220XA Unspecified fracture of sternum, initial encounter for closed fracture: Secondary | ICD-10-CM | POA: Diagnosis not present

## 2024-04-03 DIAGNOSIS — S62616A Displaced fracture of proximal phalanx of right little finger, initial encounter for closed fracture: Secondary | ICD-10-CM | POA: Diagnosis not present

## 2024-04-03 DIAGNOSIS — I129 Hypertensive chronic kidney disease with stage 1 through stage 4 chronic kidney disease, or unspecified chronic kidney disease: Secondary | ICD-10-CM | POA: Diagnosis not present

## 2024-04-03 DIAGNOSIS — G4733 Obstructive sleep apnea (adult) (pediatric): Secondary | ICD-10-CM | POA: Diagnosis not present
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
## 2024-04-04 DIAGNOSIS — I499 Cardiac arrhythmia, unspecified: Secondary | ICD-10-CM | POA: Diagnosis not present

## 2024-04-08 DIAGNOSIS — I10 Essential (primary) hypertension: Secondary | ICD-10-CM | POA: Diagnosis not present

## 2024-04-08 DIAGNOSIS — E1122 Type 2 diabetes mellitus with diabetic chronic kidney disease: Secondary | ICD-10-CM | POA: Diagnosis not present

## 2024-04-27 DIAGNOSIS — S62609A Fracture of unspecified phalanx of unspecified finger, initial encounter for closed fracture: Secondary | ICD-10-CM | POA: Diagnosis not present

## 2024-05-03 DIAGNOSIS — C44319 Basal cell carcinoma of skin of other parts of face: Secondary | ICD-10-CM | POA: Diagnosis not present

## 2024-05-03 DIAGNOSIS — L578 Other skin changes due to chronic exposure to nonionizing radiation: Secondary | ICD-10-CM | POA: Diagnosis not present

## 2024-05-03 DIAGNOSIS — L570 Actinic keratosis: Secondary | ICD-10-CM | POA: Diagnosis not present

## 2024-05-03 DIAGNOSIS — L820 Inflamed seborrheic keratosis: Secondary | ICD-10-CM | POA: Diagnosis not present

## 2024-05-03 DIAGNOSIS — L821 Other seborrheic keratosis: Secondary | ICD-10-CM | POA: Diagnosis not present

## 2024-05-05 DIAGNOSIS — M25641 Stiffness of right hand, not elsewhere classified: Secondary | ICD-10-CM | POA: Diagnosis not present

## 2024-05-09 DIAGNOSIS — I10 Essential (primary) hypertension: Secondary | ICD-10-CM | POA: Diagnosis not present

## 2024-05-09 DIAGNOSIS — E1122 Type 2 diabetes mellitus with diabetic chronic kidney disease: Secondary | ICD-10-CM | POA: Diagnosis not present

## 2024-05-11 DIAGNOSIS — C44319 Basal cell carcinoma of skin of other parts of face: Secondary | ICD-10-CM | POA: Diagnosis not present

## 2024-05-11 DIAGNOSIS — I517 Cardiomegaly: Secondary | ICD-10-CM | POA: Diagnosis not present

## 2024-05-11 DIAGNOSIS — I351 Nonrheumatic aortic (valve) insufficiency: Secondary | ICD-10-CM | POA: Diagnosis not present

## 2024-06-08 DIAGNOSIS — I10 Essential (primary) hypertension: Secondary | ICD-10-CM | POA: Diagnosis not present

## 2024-06-08 DIAGNOSIS — E1122 Type 2 diabetes mellitus with diabetic chronic kidney disease: Secondary | ICD-10-CM | POA: Diagnosis not present

## 2024-06-29 DIAGNOSIS — M5416 Radiculopathy, lumbar region: Secondary | ICD-10-CM | POA: Diagnosis not present

## 2024-06-29 DIAGNOSIS — M19012 Primary osteoarthritis, left shoulder: Secondary | ICD-10-CM | POA: Diagnosis not present

## 2024-08-09 NOTE — Progress Notes (Signed)
 Anthony Moreno                                          MRN: [id number]   08/09/2024   The [facility] Quality Team Specialist reviewed this patient medical record for the purposes of chart review for care gap closure. The following were reviewed: chart review for care gap closure-kidney health evaluation for diabetes:eGFR  and uACR.    [facility] Quality Team
